# Patient Record
Sex: Female | Born: 1955
Health system: Southern US, Community
[De-identification: ages and names within clinical notes are randomized; demographics above are authoritative.]

## PROBLEM LIST (undated history)

## (undated) DIAGNOSIS — M5430 Sciatica, unspecified side: Secondary | ICD-10-CM

## (undated) DIAGNOSIS — E119 Type 2 diabetes mellitus without complications: Secondary | ICD-10-CM

## (undated) DIAGNOSIS — M1990 Unspecified osteoarthritis, unspecified site: Secondary | ICD-10-CM

## (undated) DIAGNOSIS — J189 Pneumonia, unspecified organism: Secondary | ICD-10-CM

## (undated) DIAGNOSIS — I10 Essential (primary) hypertension: Secondary | ICD-10-CM

## (undated) HISTORY — DX: Sciatica, unspecified side: M54.30

## (undated) HISTORY — PX: TONSILLECTOMY: SUR1361

## (undated) HISTORY — DX: Type 2 diabetes mellitus without complications: E11.9

## (undated) HISTORY — DX: Unspecified osteoarthritis, unspecified site: M19.90

## (undated) HISTORY — DX: Essential (primary) hypertension: I10

---

## 2004-10-03 ENCOUNTER — Ambulatory Visit: Payer: Self-pay | Admitting: Family Medicine

## 2008-12-30 ENCOUNTER — Emergency Department (HOSPITAL_COMMUNITY): Admission: EM | Admit: 2008-12-30 | Discharge: 2008-12-30 | Payer: Self-pay | Admitting: Emergency Medicine

## 2010-08-07 ENCOUNTER — Ambulatory Visit: Payer: Self-pay | Admitting: Family Medicine

## 2011-08-13 ENCOUNTER — Ambulatory Visit: Payer: Self-pay | Admitting: Family Medicine

## 2012-11-05 ENCOUNTER — Encounter: Payer: Self-pay | Admitting: Obstetrics & Gynecology

## 2012-11-05 ENCOUNTER — Ambulatory Visit (INDEPENDENT_AMBULATORY_CARE_PROVIDER_SITE_OTHER): Payer: PRIVATE HEALTH INSURANCE | Admitting: Obstetrics & Gynecology

## 2012-11-05 VITALS — BP 130/70 | Ht 61.0 in | Wt 190.0 lb

## 2012-11-05 DIAGNOSIS — Z1212 Encounter for screening for malignant neoplasm of rectum: Secondary | ICD-10-CM

## 2012-11-05 DIAGNOSIS — Z01419 Encounter for gynecological examination (general) (routine) without abnormal findings: Secondary | ICD-10-CM

## 2012-11-05 NOTE — Progress Notes (Signed)
Patient ID: Annette Washington, female   DOB: February 20, 1956, 57 y.o.   MRN: [id number] Subjective:     Annette Washington is a 57 y.o. female here for a routine exam.  No LMP recorded. No obstetric history on file. Current complaints: none.  Personal health questionnaire reviewed: yes.   Gynecologic History No LMP recorded. Contraception: post menopausal status Last Pap: 2013. Results were: normal Last mammogram: 2013. Results were: normal  Obstetric History OB History   Grav Para Term Preterm Abortions TAB SAB Ect Mult Living                   The following portions of the patient's history were reviewed and updated as appropriate: allergies, current medications, past family history, past medical history, past social history, past surgical history and problem list.  Review of Systems  Review of Systems  Constitutional: Negative for fever, chills, weight loss, malaise/fatigue and diaphoresis.  HENT: Negative for hearing loss, ear pain, nosebleeds, congestion, sore throat, neck pain, tinnitus and ear discharge.   Eyes: Negative for blurred vision, double vision, photophobia, pain, discharge and redness.  Respiratory: Negative for cough, hemoptysis, sputum production, shortness of breath, wheezing and stridor.   Cardiovascular: Negative for chest pain, palpitations, orthopnea, claudication, leg swelling and PND.  Gastrointestinal: negative for abdominal pain. Negative for heartburn, nausea, vomiting, diarrhea, constipation, blood in stool and melena.  Genitourinary: Negative for dysuria, urgency, frequency, hematuria and flank pain.  Musculoskeletal: Negative for myalgias, back pain, joint pain and falls.  Skin: Negative for itching and rash.  Neurological: Negative for dizziness, tingling, tremors, sensory change, speech change, focal weakness, seizures, loss of consciousness, weakness and headaches.  Endo/Heme/Allergies: Negative for environmental allergies and polydipsia. Does not  bruise/bleed easily.  Psychiatric/Behavioral: Negative for depression, suicidal ideas, hallucinations, memory loss and substance abuse. The patient is not nervous/anxious and does not have insomnia.        Objective:    Physical Exam  Vitals reviewed. Constitutional: She is oriented to person, place, and time. She appears well-developed and well-nourished.  HENT:  Head: Normocephalic and atraumatic.        Right Ear: External ear normal.  Left Ear: External ear normal.  Nose: Nose normal.  Mouth/Throat: Oropharynx is clear and moist.  Eyes: Conjunctivae and EOM are normal. Pupils are equal, round, and reactive to light. Right eye exhibits no discharge. Left eye exhibits no discharge. No scleral icterus.  Neck: Normal range of motion. Neck supple. No tracheal deviation present. No thyromegaly present.  Cardiovascular: Normal rate, regular rhythm, normal heart sounds and intact distal pulses.  Exam reveals no gallop and no friction rub.   No murmur heard. Respiratory: Effort normal and breath sounds normal. No respiratory distress. She has no wheezes. She has no rales. She exhibits no tenderness.  GI: Soft. Bowel sounds are normal. She exhibits no distension and no mass. There is no tenderness. There is no rebound and no guarding.  Genitourinary:       Vulva is normal without lesions Vagina is pink moist without discharge Cervix normal in appearance and pap is done Uterus is normal size shape and contour Adnexa is negative with normal sized ovaries  Hemoccult was negative  Musculoskeletal: Normal range of motion. She exhibits no edema and no tenderness.  Neurological: She is alert and oriented to person, place, and time. She has normal reflexes. She displays normal reflexes. No cranial nerve deficit. She exhibits normal muscle tone. Coordination normal.  Skin: Skin is warm  and dry. No rash noted. No erythema. No pallor.  Psychiatric: She has a normal mood and affect. Her behavior is  normal. Judgment and thought content normal.       Assessment:    Healthy female exam.    Plan:    Mammogram ordered. Follow up in: 1 year.

## 2012-11-05 NOTE — Patient Instructions (Addendum)
Mammography Mammography is an X-ray of the breasts to look for changes that are not normal. The X-ray image is called a mammogram. This procedure can screen for breast cancer, can detect cancer early, and can diagnose cancer.  LET YOUR CAREGIVER KNOW ABOUT:  Breast implants.  Previous breast disease, biopsy, or surgery.  If you are breastfeeding.  Medicines taken, including vitamins, herbs, eyedrops, over-the-counter medicines, and creams.  Use of steroids (by mouth or creams).  Possibility of pregnancy, if this applies. RISKS AND COMPLICATIONS  Exposure to radiation, but at very low levels.  The results may be misinterpreted.  The results may not be accurate.  Mammography may lead to further tests.  Mammography may not catch certain cancers. BEFORE THE PROCEDURE  Schedule your test about 7 days after your menstrual period. This is when your breasts are the least tender and have signs of hormone changes.  If you have had a mammography done at a different facility in the past, get the mammogram X-rays or have them sent to your current exam facility in order to compare them.  Wash your breasts and under your arms the day of the test.  Do not wear deodorants, perfumes, or powders anywhere on your body.  Wear clothes that you can change in and out of easily. PROCEDURE Relax as much as possible during the test. Any discomfort during the test will be very brief. The test should take less than 30 minutes. The following will happen:  You will undress from the waist up and put on a gown.  You will stand in front of the X-ray machine.  Each breast will be placed between 2 plastic or glass plates. The plates will compress your breast for a few seconds.  X-rays will be taken from different angles of the breast. AFTER THE PROCEDURE  The mammogram will be examined.  Depending on the quality of the images, you may need to repeat certain parts of the test.  Ask when your test  results will be ready. Make sure you get your test results.  You may resume normal activities. Document Released: 06/13/2000 Document Revised: 09/08/2011 Document Reviewed: 04/06/2011 [employer] Patient Information 2013 [employer].  

## 2013-07-31 ENCOUNTER — Emergency Department (HOSPITAL_COMMUNITY)
Admission: EM | Admit: 2013-07-31 | Discharge: 2013-07-31 | Disposition: A | Discharge status: Home / Self Care | Payer: PRIVATE HEALTH INSURANCE | Attending: Emergency Medicine | Admitting: Emergency Medicine

## 2013-07-31 ENCOUNTER — Encounter (HOSPITAL_COMMUNITY): Payer: Self-pay | Admitting: Emergency Medicine

## 2013-07-31 DIAGNOSIS — I10 Essential (primary) hypertension: Secondary | ICD-10-CM | POA: Insufficient documentation

## 2013-07-31 DIAGNOSIS — R21 Rash and other nonspecific skin eruption: Secondary | ICD-10-CM | POA: Insufficient documentation

## 2013-07-31 DIAGNOSIS — Z79899 Other long term (current) drug therapy: Secondary | ICD-10-CM | POA: Insufficient documentation

## 2013-07-31 DIAGNOSIS — R509 Fever, unspecified: Secondary | ICD-10-CM | POA: Insufficient documentation

## 2013-07-31 MED ORDER — DIPHENHYDRAMINE HCL 25 MG PO TABS: 25.0000 mg | ORAL_TABLET | Freq: Three times a day (TID) | ORAL | Status: DC

## 2013-07-31 MED ORDER — FAMOTIDINE 20 MG PO TABS: 20.0000 mg | ORAL_TABLET | Freq: Two times a day (BID) | ORAL | Status: DC

## 2013-07-31 MED ORDER — PREDNISONE 20 MG PO TABS: 40.0000 mg | ORAL_TABLET | Freq: Every day | ORAL | Status: AC

## 2013-07-31 MED ORDER — PREDNISONE 50 MG PO TABS
60.0000 mg | ORAL_TABLET | ORAL | Status: AC
  Administered 2013-07-31: 60 mg via ORAL
  Filled 2013-07-31 (×2): qty 1

## 2013-07-31 MED ORDER — FAMOTIDINE 20 MG PO TABS
20.0000 mg | ORAL_TABLET | Freq: Once | ORAL | Status: AC
  Administered 2013-07-31: 20 mg via ORAL
  Filled 2013-07-31: qty 1

## 2013-07-31 NOTE — ED Provider Notes (Signed)
CSN: [id number]     Arrival date & time 07/31/13  0932 History   First MD Initiated Contact with Patient 07/31/13 0703     Chief Complaint  Patient presents with  . Rash  . Fever    HPI  The patient presents with rash. Rash began 2 days ago.  Since onset, she developed rash on her torso, legs, arms.  No facial or mucosal involvement. No dyspnea, fever, nausea, vomiting. Rash is not painful, and minimally pruritic. Some relief with Benadryl. The only new activity the patient recalls is taking a new brand of OTC pain medication the day prior to the onset of rash.   Past Medical History  Diagnosis Date  . Hypertension    Past Surgical History  Procedure Laterality Date  . Tonsillectomy     Family History  Problem Relation Age of Onset  . Kidney disease Mother   . Breast cancer Maternal Grandmother    History  Substance Use Topics  . Smoking status: Never Smoker   . Smokeless tobacco: Not on file  . Alcohol Use: No   OB History   Grav Para Term Preterm Abortions TAB SAB Ect Mult Living                 Review of Systems  Constitutional:       Per HPI, otherwise negative  HENT:       Per HPI, otherwise negative  Respiratory:       Per HPI, otherwise negative  Cardiovascular:       Per HPI, otherwise negative  Gastrointestinal: Negative for nausea and vomiting.  Endocrine:       Negative aside from HPI  Genitourinary:       Neg aside from HPI   Musculoskeletal:       Per HPI, otherwise negative  Skin: Positive for rash.  Neurological: Negative for weakness and headaches.    Allergies  Review of patient's allergies indicates no known allergies.  Home Medications   Current Outpatient Rx  Name  Route  Sig  Dispense  Refill  . diphenhydrAMINE (BENADRYL) 25 MG tablet   Oral   Take 1 tablet (25 mg total) by mouth 3 (three) times daily.   8 tablet   0   . famotidine (PEPCID) 20 MG tablet   Oral   Take 1 tablet (20 mg total) by mouth 2 (two) times daily.   5 tablet   0   . losartan-hydrochlorothiazide (HYZAAR) 50-12.5 MG per tablet   Oral   Take 1 tablet by mouth daily.         . predniSONE (DELTASONE) 20 MG tablet   Oral   Take 2 tablets (40 mg total) by mouth daily with breakfast.   4 tablet   0    BP 146/81  Pulse 97  Temp(Src) 99.1 F (37.3 C) (Oral)  Resp 20  Ht 5\' 1"  (1.549 m)  Wt 178 lb (80.74 kg)  BMI 33.65 kg/m2  SpO2 99% Physical Exam  Nursing note and vitals reviewed. Constitutional: She is oriented to person, place, and time. She appears well-developed and well-nourished. No distress.  HENT:  Head: Normocephalic and atraumatic.  Mouth/Throat: Uvula is midline and mucous membranes are normal. No uvula swelling. No oropharyngeal exudate, posterior oropharyngeal edema, posterior oropharyngeal erythema or tonsillar abscesses.  Eyes: Conjunctivae and EOM are normal.  Cardiovascular: Normal rate and regular rhythm.   Pulmonary/Chest: Effort normal and breath sounds normal. No stridor. No respiratory distress.  Abdominal:  She exhibits no distension.  Musculoskeletal: She exhibits no edema.  Neurological: She is alert and oriented to person, place, and time. No cranial nerve deficit.  Skin: Skin is warm and dry. Rash noted.  Scattered throughout the torso, both legs, arms, but with no glabrous skin involvement there is a minimally raised, confluent patchy rash.  Psychiatric: She has a normal mood and affect.    ED Course  Procedures (including critical care time) Labs Review Labs Reviewed - No data to display Imaging Review No results found.  EKG Interpretation   None       MDM   1. Rash      This generally well female presents with rash.  Notably the patient has no fever, no dyspnea, no facial or mucosal involvement.  There is low suspicion for systemic pathology given this reassuring findings.  No clear precipitant, the patient is a new medication suggests allergic reaction.  Absent distress, with  stable vital signs, the patient was discharged in stable condition to follow up with primary care or dermatology as needed.  Carmin Muskrat, MD 07/31/13 [id number]

## 2013-07-31 NOTE — ED Notes (Addendum)
Pt c/o fever that started Friday, rash that started Saturday, denies any new foods or medications, states that the rash has started itching this am, reports that she had not traveled anywhere and did receive her immunization for measles. Pt has raised red areas ?hives over entire body area, denies any sob, problems swallowing,

## 2013-07-31 NOTE — Discharge Instructions (Signed)
As discussed, there are currently no concerning aspects of your rash, and it should disappear over the next days with appropriate medication use.  However, please be sure to return here if you develop new, or concerning changes in your condition.

## 2013-11-17 ENCOUNTER — Other Ambulatory Visit: Payer: Self-pay | Admitting: Obstetrics & Gynecology

## 2013-11-17 DIAGNOSIS — Z1231 Encounter for screening mammogram for malignant neoplasm of breast: Secondary | ICD-10-CM

## 2013-12-02 ENCOUNTER — Other Ambulatory Visit: Payer: PRIVATE HEALTH INSURANCE | Admitting: Obstetrics & Gynecology

## 2013-12-02 ENCOUNTER — Ambulatory Visit (HOSPITAL_COMMUNITY): Payer: PRIVATE HEALTH INSURANCE

## 2013-12-09 ENCOUNTER — Ambulatory Visit (HOSPITAL_COMMUNITY)
Admission: RE | Admit: 2013-12-09 | Discharge: 2013-12-09 | Disposition: A | Discharge status: Home / Self Care | Payer: PRIVATE HEALTH INSURANCE | Source: Ambulatory Visit | Attending: Obstetrics & Gynecology | Admitting: Obstetrics & Gynecology

## 2013-12-09 DIAGNOSIS — Z1231 Encounter for screening mammogram for malignant neoplasm of breast: Secondary | ICD-10-CM | POA: Insufficient documentation

## 2013-12-23 ENCOUNTER — Encounter: Payer: Self-pay | Admitting: Obstetrics & Gynecology

## 2013-12-23 ENCOUNTER — Ambulatory Visit (INDEPENDENT_AMBULATORY_CARE_PROVIDER_SITE_OTHER): Payer: PRIVATE HEALTH INSURANCE | Admitting: Obstetrics & Gynecology

## 2013-12-23 ENCOUNTER — Other Ambulatory Visit (HOSPITAL_COMMUNITY)
Admission: RE | Admit: 2013-12-23 | Discharge: 2013-12-23 | Disposition: A | Discharge status: Home / Self Care | Payer: PRIVATE HEALTH INSURANCE | Source: Ambulatory Visit | Attending: Obstetrics & Gynecology | Admitting: Obstetrics & Gynecology

## 2013-12-23 VITALS — BP 110/80 | Ht 60.0 in | Wt 192.0 lb

## 2013-12-23 DIAGNOSIS — Z01419 Encounter for gynecological examination (general) (routine) without abnormal findings: Secondary | ICD-10-CM | POA: Diagnosis not present

## 2013-12-23 DIAGNOSIS — Z1212 Encounter for screening for malignant neoplasm of rectum: Secondary | ICD-10-CM

## 2013-12-23 DIAGNOSIS — I10 Essential (primary) hypertension: Secondary | ICD-10-CM | POA: Insufficient documentation

## 2013-12-23 DIAGNOSIS — Z1151 Encounter for screening for human papillomavirus (HPV): Secondary | ICD-10-CM | POA: Insufficient documentation

## 2013-12-23 NOTE — Progress Notes (Signed)
Patient ID: [name], female   DOB: [birth year], 58 y.o.   MRN: [id number] Blood pressure 110/80, height 5' (1.524 m), weight 192 lb (87.091 kg).  Subjective:     Annette Washington is a 58 y.o. female here for a routine exam.  No LMP recorded. Patient is postmenopausal. No obstetric history on file. Birth Control Method:  na Menstrual Calendar(currently): post menopausal  Current complaints: post menopausal.   Current acute medical issues:  Sinusitis s/p antibiotics   Recent Gynecologic History No LMP recorded. Patient is postmenopausal. Last Pap: 2014,  normal Last mammogram: 11/2013,  normal  Past Medical History  Diagnosis Date  . Hypertension     Past Surgical History  Procedure Laterality Date  . Tonsillectomy      OB History   Grav Para Term Preterm Abortions TAB SAB Ect Mult Living                  History   Social History  . Marital Status: Married    Spouse Name: N/A    Number of Children: N/A  . Years of Education: N/A   Social History Main Topics  . Smoking status: Never Smoker   . Smokeless tobacco: None  . Alcohol Use: No  . Drug Use: No  . Sexual Activity: Yes   Other Topics Concern  . None   Social History Narrative  . None    Family History  Problem Relation Age of Onset  . Kidney disease Mother   . Breast cancer Maternal Grandmother      Review of Systems  Review of Systems  Constitutional: Negative for fever, chills, weight loss, malaise/fatigue and diaphoresis.  HENT: Negative for hearing loss, ear pain, nosebleeds, congestion, sore throat, neck pain, tinnitus and ear discharge.   Eyes: Negative for blurred vision, double vision, photophobia, pain, discharge and redness.  Respiratory: Negative for cough, hemoptysis, sputum production, shortness of breath, wheezing and stridor.   Cardiovascular: Negative for chest pain, palpitations, orthopnea, claudication, leg swelling and PND.  Gastrointestinal: negative for abdominal  pain. Negative for heartburn, nausea, vomiting, diarrhea, constipation, blood in stool and melena.  Genitourinary: Negative for dysuria, urgency, frequency, hematuria and flank pain.  Musculoskeletal: Negative for myalgias, back pain, joint pain and falls.  Skin: Negative for itching and rash.  Neurological: Negative for dizziness, tingling, tremors, sensory change, speech change, focal weakness, seizures, loss of consciousness, weakness and headaches.  Endo/Heme/Allergies: Negative for environmental allergies and polydipsia. Does not bruise/bleed easily.  Psychiatric/Behavioral: Negative for depression, suicidal ideas, hallucinations, memory loss and substance abuse. The patient is not nervous/anxious and does not have insomnia.        Objective:    Physical Exam  Vitals reviewed. Constitutional: She is oriented to person, place, and time. She appears well-developed and well-nourished.  HENT:  Head: Normocephalic and atraumatic.        Right Ear: External ear normal.  Left Ear: External ear normal.  Nose: Nose normal.  Mouth/Throat: Oropharynx is clear and moist.  Eyes: Conjunctivae and EOM are normal. Pupils are equal, round, and reactive to light. Right eye exhibits no discharge. Left eye exhibits no discharge. No scleral icterus.  Neck: Normal range of motion. Neck supple. No tracheal deviation present. No thyromegaly present.  Cardiovascular: Normal rate, regular rhythm, normal heart sounds and intact distal pulses.  Exam reveals no gallop and no friction rub.   No murmur heard. Respiratory: Effort normal and breath sounds normal. No respiratory distress. She has no  wheezes. She has no rales. She exhibits no tenderness.  GI: Soft. Bowel sounds are normal. She exhibits no distension and no mass. There is no tenderness. There is no rebound and no guarding.  Genitourinary:  Breasts no masses skin changes or nipple changes bilaterally      Vulva is normal without lesions Vagina is pink  moist without discharge Cervix normal in appearance and pap is done Uterus is normal size shape and contour Adnexa is negative with normal sized ovaries  Rectal    hemoccult negative, normal tone, no masses  Musculoskeletal: Normal range of motion. She exhibits no edema and no tenderness.  Neurological: She is alert and oriented to person, place, and time. She has normal reflexes. She displays normal reflexes. No cranial nerve deficit. She exhibits normal muscle tone. Coordination normal.  Skin: Skin is warm and dry. No rash noted. No erythema. No pallor.  Psychiatric: She has a normal mood and affect. Her behavior is normal. Judgment and thought content normal.       Assessment:    Healthy female exam.    Plan:    Follow up in: 1 year.

## 2013-12-28 LAB — CYTOLOGY - PAP

## 2015-09-14 ENCOUNTER — Other Ambulatory Visit: Payer: PRIVATE HEALTH INSURANCE | Admitting: Obstetrics & Gynecology

## 2015-09-20 ENCOUNTER — Other Ambulatory Visit: Payer: Self-pay | Admitting: Obstetrics & Gynecology

## 2015-09-20 DIAGNOSIS — Z1231 Encounter for screening mammogram for malignant neoplasm of breast: Secondary | ICD-10-CM

## 2015-09-21 ENCOUNTER — Ambulatory Visit (INDEPENDENT_AMBULATORY_CARE_PROVIDER_SITE_OTHER): Payer: PRIVATE HEALTH INSURANCE | Admitting: Obstetrics & Gynecology

## 2015-09-21 ENCOUNTER — Other Ambulatory Visit (HOSPITAL_COMMUNITY)
Admission: RE | Admit: 2015-09-21 | Discharge: 2015-09-21 | Disposition: A | Discharge status: Home / Self Care | Payer: PRIVATE HEALTH INSURANCE | Source: Ambulatory Visit | Attending: Obstetrics & Gynecology | Admitting: Obstetrics & Gynecology

## 2015-09-21 ENCOUNTER — Encounter: Payer: Self-pay | Admitting: Obstetrics & Gynecology

## 2015-09-21 VITALS — BP 140/80 | HR 72 | Ht 61.0 in | Wt 193.0 lb

## 2015-09-21 DIAGNOSIS — Z01419 Encounter for gynecological examination (general) (routine) without abnormal findings: Secondary | ICD-10-CM | POA: Insufficient documentation

## 2015-09-21 DIAGNOSIS — Z1211 Encounter for screening for malignant neoplasm of colon: Secondary | ICD-10-CM

## 2015-09-21 DIAGNOSIS — Z1212 Encounter for screening for malignant neoplasm of rectum: Secondary | ICD-10-CM

## 2015-09-21 NOTE — Progress Notes (Signed)
Patient ID: Annette Washington, female   DOB: 1955/07/04, 60 y.o.   MRN: [id number] Subjective:     Annette Washington is a 60 y.o. female here for a routine exam.  No LMP recorded. Patient is postmenopausal. No obstetric history on file. Birth Control Method:  amenorrheic Menstrual Calendar(currently): amenorrheic  Current complaints: none.   Current acute medical issues:  none   Recent Gynecologic History No LMP recorded. Patient is postmenopausal. Last Pap: 2016,  normal Last mammogram: next week,    Past Medical History  Diagnosis Date  . Hypertension   . Diabetes mellitus without complication [facility])     Past Surgical History  Procedure Laterality Date  . Tonsillectomy      OB History    No [place] available      Social History   Social History  . Marital Status: Married    Spouse Name: N/A  . Number of Children: N/A  . Years of Education: N/A   Social History Main Topics  . Smoking status: Never Smoker   . Smokeless tobacco: None  . Alcohol Use: No  . Drug Use: No  . Sexual Activity: Yes   Other Topics Concern  . None   Social History Narrative    Family History  Problem Relation Age of Onset  . Kidney disease Mother   . Breast cancer Maternal Grandmother      Current outpatient prescriptions:  .  amLODipine (NORVASC) 5 MG tablet, Take 5 mg by mouth daily., Disp: , Rfl:  .  losartan-hydrochlorothiazide (HYZAAR) 50-12.5 MG per tablet, Take 1 tablet by mouth daily., Disp: , Rfl:  .  metFORMIN (GLUCOPHAGE) 500 MG tablet, Take by mouth 2 (two) times daily with a meal., Disp: , Rfl:  .  pravastatin (PRAVACHOL) 10 MG tablet, Take 10 mg by mouth daily., Disp: , Rfl:  .  diphenhydrAMINE (BENADRYL) 25 MG tablet, Take 1 tablet (25 mg total) by mouth 3 (three) times daily., Disp: 8 tablet, Rfl: 0 .  famotidine (PEPCID) 20 MG tablet, Take 1 tablet (20 mg total) by mouth 2 (two) times daily., Disp: 5 tablet, Rfl: 0  Review of Systems  Review of Systems   Constitutional: Negative for fever, chills, weight loss, malaise/fatigue and diaphoresis.  HENT: Negative for hearing loss, ear pain, nosebleeds, congestion, sore throat, neck pain, tinnitus and ear discharge.   Eyes: Negative for blurred vision, double vision, photophobia, pain, discharge and redness.  Respiratory: Negative for cough, hemoptysis, sputum production, shortness of breath, wheezing and stridor.   Cardiovascular: Negative for chest pain, palpitations, orthopnea, claudication, leg swelling and PND.  Gastrointestinal: negative for abdominal pain. Negative for heartburn, nausea, vomiting, diarrhea, constipation, blood in stool and melena.  Genitourinary: Negative for dysuria, urgency, frequency, hematuria and flank pain.  Musculoskeletal: Negative for myalgias, back pain, joint pain and falls.  Skin: Negative for itching and rash.  Neurological: Negative for dizziness, tingling, tremors, sensory change, speech change, focal weakness, seizures, loss of consciousness, weakness and headaches.  Endo/Heme/Allergies: Negative for environmental allergies and polydipsia. Does not bruise/bleed easily.  Psychiatric/Behavioral: Negative for depression, suicidal ideas, hallucinations, memory loss and substance abuse. The patient is not nervous/anxious and does not have insomnia.        Objective:  Blood pressure 140/80, pulse 72, height 5\' 1"  (1.549 m), weight 193 lb (87.544 kg).   Physical Exam  Vitals reviewed. Constitutional: She is oriented to person, place, and time. She appears well-developed and well-nourished.  HENT:  Head: Normocephalic and atraumatic.  Right Ear: External ear normal.  Left Ear: External ear normal.  Nose: Nose normal.  Mouth/Throat: Oropharynx is clear and moist.  Eyes: Conjunctivae and EOM are normal. Pupils are equal, round, and reactive to light. Right eye exhibits no discharge. Left eye exhibits no discharge. No scleral icterus.  Neck: Normal range of  motion. Neck supple. No tracheal deviation present. No thyromegaly present.  Cardiovascular: Normal rate, regular rhythm, normal heart sounds and intact distal pulses.  Exam reveals no gallop and no friction rub.   No murmur heard. Respiratory: Effort normal and breath sounds normal. No respiratory distress. She has no wheezes. She has no rales. She exhibits no tenderness.  GI: Soft. Bowel sounds are normal. She exhibits no distension and no mass. There is no tenderness. There is no rebound and no guarding.  Genitourinary:  Breasts no masses skin changes or nipple changes bilaterally      Vulva is normal without lesions Vagina is pink moist without discharge Cervix normal in appearance and pap is done Uterus is normal size shape and contour Adnexa is negative with normal sized ovaries  {Rectal    hemoccult negative, normal tone, no masses  Musculoskeletal: Normal range of motion. She exhibits no edema and no tenderness.  Neurological: She is alert and oriented to person, place, and time. She has normal reflexes. She displays normal reflexes. No cranial nerve deficit. She exhibits normal muscle tone. Coordination normal.  Skin: Skin is warm and dry. No rash noted. No erythema. No pallor.  Psychiatric: She has a normal mood and affect. Her behavior is normal. Judgment and thought content normal.       Assessment:    Healthy female exam.    Plan:    Mammogram ordered. Follow up in: 1 year.    Meds ordered this encounter  Medications  . pravastatin (PRAVACHOL) 10 MG tablet    Sig: Take 10 mg by mouth daily.  Marland Kitchen amLODipine (NORVASC) 5 MG tablet    Sig: Take 5 mg by mouth daily.  . metFORMIN (GLUCOPHAGE) 500 MG tablet    Sig: Take by mouth 2 (two) times daily with a meal.    No orders of the defined types were placed in this encounter.

## 2015-09-25 LAB — CYTOLOGY - PAP

## 2015-09-28 ENCOUNTER — Ambulatory Visit (HOSPITAL_COMMUNITY): Payer: PRIVATE HEALTH INSURANCE

## 2016-01-04 ENCOUNTER — Ambulatory Visit (HOSPITAL_COMMUNITY)
Admission: RE | Admit: 2016-01-04 | Discharge: 2016-01-04 | Disposition: A | Discharge status: Home / Self Care | Payer: PRIVATE HEALTH INSURANCE | Source: Ambulatory Visit | Attending: Obstetrics & Gynecology | Admitting: Obstetrics & Gynecology

## 2016-01-04 DIAGNOSIS — Z1231 Encounter for screening mammogram for malignant neoplasm of breast: Secondary | ICD-10-CM | POA: Insufficient documentation

## 2016-08-22 ENCOUNTER — Encounter (INDEPENDENT_AMBULATORY_CARE_PROVIDER_SITE_OTHER): Payer: Self-pay | Admitting: *Deleted

## 2016-10-03 ENCOUNTER — Other Ambulatory Visit: Payer: PRIVATE HEALTH INSURANCE | Admitting: Obstetrics & Gynecology

## 2016-10-09 ENCOUNTER — Encounter (INDEPENDENT_AMBULATORY_CARE_PROVIDER_SITE_OTHER): Payer: Self-pay | Admitting: *Deleted

## 2016-10-10 ENCOUNTER — Other Ambulatory Visit (INDEPENDENT_AMBULATORY_CARE_PROVIDER_SITE_OTHER): Payer: Self-pay | Admitting: *Deleted

## 2016-10-10 DIAGNOSIS — Z1211 Encounter for screening for malignant neoplasm of colon: Secondary | ICD-10-CM

## 2016-12-02 ENCOUNTER — Other Ambulatory Visit: Payer: Self-pay | Admitting: Obstetrics & Gynecology

## 2016-12-02 DIAGNOSIS — Z1231 Encounter for screening mammogram for malignant neoplasm of breast: Secondary | ICD-10-CM

## 2016-12-30 ENCOUNTER — Telehealth (INDEPENDENT_AMBULATORY_CARE_PROVIDER_SITE_OTHER): Payer: Self-pay | Admitting: *Deleted

## 2016-12-30 ENCOUNTER — Encounter (INDEPENDENT_AMBULATORY_CARE_PROVIDER_SITE_OTHER): Payer: Self-pay | Admitting: *Deleted

## 2016-12-30 DIAGNOSIS — Z1211 Encounter for screening for malignant neoplasm of colon: Secondary | ICD-10-CM

## 2016-12-30 NOTE — Telephone Encounter (Signed)
Patient needs trilyte -- screening

## 2017-01-01 MED ORDER — PEG 3350-KCL-NA BICARB-NACL 420 G PO SOLR: 4000.0000 mL | Freq: Once | ORAL | 0 refills | Status: AC

## 2017-01-09 ENCOUNTER — Encounter: Payer: Self-pay | Admitting: Obstetrics & Gynecology

## 2017-01-09 ENCOUNTER — Other Ambulatory Visit (HOSPITAL_COMMUNITY)
Admission: RE | Admit: 2017-01-09 | Discharge: 2017-01-09 | Disposition: A | Discharge status: Home / Self Care | Payer: PRIVATE HEALTH INSURANCE | Source: Ambulatory Visit | Attending: Obstetrics & Gynecology | Admitting: Obstetrics & Gynecology

## 2017-01-09 ENCOUNTER — Ambulatory Visit (INDEPENDENT_AMBULATORY_CARE_PROVIDER_SITE_OTHER): Payer: PRIVATE HEALTH INSURANCE | Admitting: Obstetrics & Gynecology

## 2017-01-09 ENCOUNTER — Ambulatory Visit (HOSPITAL_COMMUNITY)
Admission: RE | Admit: 2017-01-09 | Discharge: 2017-01-09 | Disposition: A | Discharge status: Home / Self Care | Payer: PRIVATE HEALTH INSURANCE | Source: Ambulatory Visit | Attending: Obstetrics & Gynecology | Admitting: Obstetrics & Gynecology

## 2017-01-09 VITALS — BP 148/90 | HR 60 | Ht 60.5 in | Wt 192.0 lb

## 2017-01-09 DIAGNOSIS — Z01419 Encounter for gynecological examination (general) (routine) without abnormal findings: Secondary | ICD-10-CM | POA: Diagnosis not present

## 2017-01-09 DIAGNOSIS — Z1211 Encounter for screening for malignant neoplasm of colon: Secondary | ICD-10-CM

## 2017-01-09 DIAGNOSIS — Z1212 Encounter for screening for malignant neoplasm of rectum: Secondary | ICD-10-CM | POA: Diagnosis not present

## 2017-01-09 DIAGNOSIS — Z1231 Encounter for screening mammogram for malignant neoplasm of breast: Secondary | ICD-10-CM | POA: Diagnosis not present

## 2017-01-09 LAB — HEMOCCULT GUIAC POC 1CARD (OFFICE): FECAL OCCULT BLD: NEGATIVE

## 2017-01-09 NOTE — Addendum Note (Signed)
Addended by: Gaylyn Rong A on: 01/09/2017 10:22 AM   Modules accepted: Orders

## 2017-01-09 NOTE — Progress Notes (Signed)
Subjective:     Annette Washington is a 61 y.o. female here for a routine exam.  No LMP recorded. Patient is postmenopausal. [id number] Birth Control Method:  postmenopausal Menstrual Calendar(currently): amenorrheic  Current complaints: none.   Current acute medical issues:  none   Recent Gynecologic History No LMP recorded. Patient is postmenopausal. Last Pap: 08/2015,  normal Last mammogram: today,  pending  Past Medical History:  Diagnosis Date  . Diabetes mellitus without complication ([place])   . Hypertension     Past Surgical History:  Procedure Laterality Date  . TONSILLECTOMY      OB History    Gravida Para Term Preterm AB Living   3 3 3     2    SAB TAB Ectopic Multiple Live Births           2      Social History   Social History  . Marital status: Married    Spouse name: N/A  . Number of children: N/A  . Years of education: N/A   Social History Main Topics  . Smoking status: Former Smoker    Types: Cigarettes  . Smokeless tobacco: Never Used  . Alcohol use No  . Drug use: No  . Sexual activity: Yes    Birth control/ protection: Post-menopausal   Other Topics Concern  . None   Social History Narrative  . None    Family History  Problem Relation Age of Onset  . Kidney disease Mother   . Breast cancer Maternal Grandmother      Current Outpatient Prescriptions:  .  amLODipine (NORVASC) 5 MG tablet, Take 5 mg by mouth daily., Disp: , Rfl:  .  ibuprofen (ADVIL,MOTRIN) 200 MG tablet, Take 600 mg by mouth as needed., Disp: , Rfl:  .  losartan-hydrochlorothiazide (HYZAAR) 50-12.5 MG per tablet, Take 1 tablet by mouth daily., Disp: , Rfl:  .  metFORMIN (GLUCOPHAGE) 500 MG tablet, Take by mouth daily with breakfast. , Disp: , Rfl:  .  pravastatin (PRAVACHOL) 10 MG tablet, Take 10 mg by mouth daily., Disp: , Rfl:  .  diphenhydrAMINE (BENADRYL) 25 MG tablet, Take 1 tablet (25 mg total) by mouth 3 (three) times daily., Disp: 8 tablet, Rfl: 0 .  famotidine  (PEPCID) 20 MG tablet, Take 1 tablet (20 mg total) by mouth 2 (two) times daily., Disp: 5 tablet, Rfl: 0  Review of Systems  Review of Systems  Constitutional: Negative for fever, chills, weight loss, malaise/fatigue and diaphoresis.  HENT: Negative for hearing loss, ear pain, nosebleeds, congestion, sore throat, neck pain, tinnitus and ear discharge.   Eyes: Negative for blurred vision, double vision, photophobia, pain, discharge and redness.  Respiratory: Negative for cough, hemoptysis, sputum production, shortness of breath, wheezing and stridor.   Cardiovascular: Negative for chest pain, palpitations, orthopnea, claudication, leg swelling and PND.  Gastrointestinal: negative for abdominal pain. Negative for heartburn, nausea, vomiting, diarrhea, constipation, blood in stool and melena.  Genitourinary: Negative for dysuria, urgency, frequency, hematuria and flank pain.  Musculoskeletal: Negative for myalgias, back pain, joint pain and falls.  Skin: Negative for itching and rash.  Neurological: Negative for dizziness, tingling, tremors, sensory change, speech change, focal weakness, seizures, loss of consciousness, weakness and headaches.  Endo/Heme/Allergies: Negative for environmental allergies and polydipsia. Does not bruise/bleed easily.  Psychiatric/Behavioral: Negative for depression, suicidal ideas, hallucinations, memory loss and substance abuse. The patient is not nervous/anxious and does not have insomnia.        Objective:  Blood pressure Marland Kitchen)  148/90, pulse 60, height 5' 0.5" (1.537 m), weight 192 lb (87.1 kg).   Physical Exam  Vitals reviewed. Constitutional: She is oriented to person, place, and time. She appears well-developed and well-nourished.  HENT:  Head: Normocephalic and atraumatic.        Right Ear: External ear normal.  Left Ear: External ear normal.  Nose: Nose normal.  Mouth/Throat: Oropharynx is clear and moist.  Eyes: Conjunctivae and EOM are normal. Pupils  are equal, round, and reactive to light. Right eye exhibits no discharge. Left eye exhibits no discharge. No scleral icterus.  Neck: Normal range of motion. Neck supple. No tracheal deviation present. No thyromegaly present.  Cardiovascular: Normal rate, regular rhythm, normal heart sounds and intact distal pulses.  Exam reveals no gallop and no friction rub.   No murmur heard. Respiratory: Effort normal and breath sounds normal. No respiratory distress. She has no wheezes. She has no rales. She exhibits no tenderness.  GI: Soft. Bowel sounds are normal. She exhibits no distension and no mass. There is no tenderness. There is no rebound and no guarding.  Genitourinary:  Breasts no masses skin changes or nipple changes bilaterally      Vulva is normal without lesions Vagina is pink moist without discharge Cervix normal in appearance and pap is done Uterus is normal size shape and contour Adnexa is negative with normal sized ovaries  {Rectal    hemoccult negative, normal tone, no masses  Musculoskeletal: Normal range of motion. She exhibits no edema and no tenderness.  Neurological: She is alert and oriented to person, place, and time. She has normal reflexes. She displays normal reflexes. No cranial nerve deficit. She exhibits normal muscle tone. Coordination normal.  Skin: Skin is warm and dry. No rash noted. No erythema. No pallor.  Psychiatric: She has a normal mood and affect. Her behavior is normal. Judgment and thought content normal.       Medications Ordered at today's visit: Meds ordered this encounter  Medications  . ibuprofen (ADVIL,MOTRIN) 200 MG tablet    Sig: Take 600 mg by mouth as needed.    Other orders placed at today's visit: No orders of the defined types were placed in this encounter.     Assessment:    Healthy female exam.   Hypertension Type 2 diabetes Plan:    Mammogram ordered. Follow up in: 2 years.     Return in about 2 years (around 01/10/2019)  for yearly, with Dr Elonda Husky.

## 2017-01-13 LAB — CYTOLOGY - PAP
DIAGNOSIS: NEGATIVE
HPV (WINDOPATH): NOT DETECTED

## 2017-01-16 ENCOUNTER — Telehealth (INDEPENDENT_AMBULATORY_CARE_PROVIDER_SITE_OTHER): Payer: Self-pay | Admitting: *Deleted

## 2017-01-16 NOTE — Telephone Encounter (Signed)
agree

## 2017-01-16 NOTE — Telephone Encounter (Signed)
Referring MD/PCP: kikel ([facility])   Procedure: tcs  Reason/Indication:  screening  Has patient had this procedure before?  no  If so, when, by whom and where?    Is there a family history of colon cancer?  no  Who?  What age when diagnosed?    Is patient diabetic?   borderline      Does patient have prosthetic heart valve or mechanical valve?  no  Do you have a pacemaker?  no  Has patient ever had endocarditis? no  Has patient had joint replacement within last 12 months?  no  Does patient tend to be constipated or take laxatives? no  Does patient have a history of alcohol/drug use?  no  Is patient on Coumadin, Plavix and/or Aspirin? no  Medications: amlodipine 5 mg daily, metformin 500 mg daily, losartan/hctyz 50/12.5 mg daily, zyrtec prn  Allergies: nkda  Medication Adjustment per Dr Laural Golden:   Procedure date & time: 02/12/17 at 730

## 2017-02-12 ENCOUNTER — Encounter (HOSPITAL_COMMUNITY): Payer: Self-pay | Admitting: *Deleted

## 2017-02-12 ENCOUNTER — Ambulatory Visit (HOSPITAL_COMMUNITY)
Admission: RE | Admit: 2017-02-12 | Discharge: 2017-02-12 | Disposition: A | Discharge status: Home / Self Care | Payer: PRIVATE HEALTH INSURANCE | Source: Ambulatory Visit | Attending: Internal Medicine | Admitting: Internal Medicine

## 2017-02-12 ENCOUNTER — Encounter (HOSPITAL_COMMUNITY): Payer: Self-pay | Admitting: Anesthesiology

## 2017-02-12 ENCOUNTER — Encounter (HOSPITAL_COMMUNITY)
Admission: RE | Disposition: A | Discharge status: Home / Self Care | Payer: Self-pay | Source: Ambulatory Visit | Attending: Internal Medicine

## 2017-02-12 DIAGNOSIS — Z79899 Other long term (current) drug therapy: Secondary | ICD-10-CM | POA: Diagnosis not present

## 2017-02-12 DIAGNOSIS — I10 Essential (primary) hypertension: Secondary | ICD-10-CM | POA: Diagnosis not present

## 2017-02-12 DIAGNOSIS — Z87891 Personal history of nicotine dependence: Secondary | ICD-10-CM | POA: Diagnosis not present

## 2017-02-12 DIAGNOSIS — E119 Type 2 diabetes mellitus without complications: Secondary | ICD-10-CM | POA: Insufficient documentation

## 2017-02-12 DIAGNOSIS — D123 Benign neoplasm of transverse colon: Secondary | ICD-10-CM | POA: Insufficient documentation

## 2017-02-12 DIAGNOSIS — Z1211 Encounter for screening for malignant neoplasm of colon: Secondary | ICD-10-CM | POA: Diagnosis not present

## 2017-02-12 DIAGNOSIS — Z886 Allergy status to analgesic agent status: Secondary | ICD-10-CM | POA: Insufficient documentation

## 2017-02-12 DIAGNOSIS — Z7984 Long term (current) use of oral hypoglycemic drugs: Secondary | ICD-10-CM | POA: Diagnosis not present

## 2017-02-12 HISTORY — DX: Pneumonia, unspecified organism: J18.9

## 2017-02-12 HISTORY — PX: COLONOSCOPY: SHX5424

## 2017-02-12 LAB — GLUCOSE, CAPILLARY: GLUCOSE-CAPILLARY: 86 mg/dL (ref 65–99)

## 2017-02-12 SURGERY — COLONOSCOPY
Anesthesia: Moderate Sedation

## 2017-02-12 MED ORDER — MIDAZOLAM HCL 5 MG/5ML IJ SOLN
INTRAMUSCULAR | Status: DC | PRN
  Administered 2017-02-12: 1 mg via INTRAVENOUS
  Administered 2017-02-12 (×2): 2 mg via INTRAVENOUS

## 2017-02-12 MED ORDER — MEPERIDINE HCL 50 MG/ML IJ SOLN
INTRAMUSCULAR | Status: DC | PRN
  Administered 2017-02-12 (×2): 25 mg via INTRAVENOUS

## 2017-02-12 MED ORDER — SPOT INK MARKER SYRINGE KIT
PACK | SUBMUCOSAL | Status: DC | PRN
  Administered 2017-02-12: 2 mL via SUBMUCOSAL

## 2017-02-12 MED ORDER — SODIUM CHLORIDE 0.9 % IV SOLN
INTRAVENOUS | Status: DC
  Administered 2017-02-12: 1000 mL via INTRAVENOUS

## 2017-02-12 MED ORDER — SIMETHICONE 40 MG/0.6ML PO SUSP
ORAL | Status: DC | PRN
  Administered 2017-02-12: 08:00:00

## 2017-02-12 MED ORDER — MEPERIDINE HCL 50 MG/ML IJ SOLN
INTRAMUSCULAR | Status: AC
  Filled 2017-02-12: qty 1

## 2017-02-12 MED ORDER — SPOT INK MARKER SYRINGE KIT
PACK | SUBMUCOSAL | Status: AC
  Filled 2017-02-12: qty 5

## 2017-02-12 MED ORDER — MIDAZOLAM HCL 5 MG/5ML IJ SOLN
INTRAMUSCULAR | Status: AC
  Filled 2017-02-12: qty 10

## 2017-02-12 NOTE — H&P (Signed)
Annette Washington is an 61 y.o. female.   Chief Complaint: Patient is here for colonoscopy. HPI: Patient is 61 year old African-American female was in for screening colonoscopy. She denies abdominal pain change in bowel habits or rectal bleeding. This is patient's first screening exam. Family history is negative for CRC.  Past Medical History:  Diagnosis Date  . Diabetes mellitus without complication ([place])   . Hypertension   . Pneumonia 20 years ago.     Past Surgical History:  Procedure Laterality Date  . TONSILLECTOMY      Family History  Problem Relation Age of Onset  . Kidney disease Mother   . Breast cancer Maternal Grandmother    Social History:  reports that she has quit smoking. Her smoking use included Cigarettes. She has never used smokeless tobacco. She reports that she does not drink alcohol or use drugs.  Allergies:  Allergies  Allergen Reactions  . Tylenol [Acetaminophen] Rash    Medications Prior to Admission  Medication Sig Dispense Refill  . amLODipine (NORVASC) 5 MG tablet Take 5 mg by mouth daily.    . cetirizine (ZYRTEC) 10 MG tablet Take 10 mg by mouth daily as needed for allergies.    Marland Kitchen ibuprofen (ADVIL,MOTRIN) 200 MG tablet Take 600 mg by mouth daily as needed for headache or moderate pain.     Marland Kitchen losartan-hydrochlorothiazide (HYZAAR) 50-12.5 MG per tablet Take 1 tablet by mouth daily.    . metFORMIN (GLUCOPHAGE) 500 MG tablet Take 500 mg by mouth every evening.     . pravastatin (PRAVACHOL) 10 MG tablet Take 10 mg by mouth daily.      Results for orders placed or performed during the hospital encounter of 02/12/17 (from the past 48 hour(s))  Glucose, capillary     Status: None   Collection Time: 02/12/17  6:54 AM  Result Value Ref Range   Glucose-Capillary 86 65 - 99 mg/dL   No results found.  ROS  Blood pressure (!) 150/72, pulse 70, temperature 98.7 F (37.1 C), temperature source Oral, resp. rate (!) 21, height 5\' 1"  (1.549 m), weight 191  lb (86.6 kg), SpO2 97 %. Physical Exam  Constitutional: She appears well-developed and well-nourished.  HENT:  Mouth/Throat: Oropharynx is clear and moist.  Eyes: Conjunctivae are normal. No scleral icterus.  Neck: No thyromegaly present.  Cardiovascular: Normal rate, regular rhythm and normal heart sounds.   No murmur heard. Respiratory: Effort normal and breath sounds normal.  GI: Soft. She exhibits no distension and no mass. There is no tenderness.  Musculoskeletal: She exhibits no edema.  Lymphadenopathy:    She has no cervical adenopathy.  Neurological: She is alert.  Skin: Skin is warm and dry.     Assessment/Plan Average risk screening colonoscopy.  Hildred Laser, MD 02/12/2017, 7:32 AM

## 2017-02-12 NOTE — Op Note (Signed)
[facility] Patient Name: Annette Washington Procedure Date: 02/12/2017 7:14 AM MRN: [id number] Date of Birth: 17-May-1956 Attending MD: Hildred Laser , MD CSN: [id number] Age: 61 Admit Type: Outpatient Procedure:                Colonoscopy Indications:              Screening for colorectal malignant neoplasm Providers:                Hildred Laser, MD, Otis Peak B. Sharon Seller, RN, Rosina Lowenstein, RN Referring MD:             Vidal Schwalbe, MD Medicines:                Meperidine 50 mg IV, Midazolam 5 mg IV Complications:            No immediate complications. Estimated Blood Loss:     Estimated blood loss: none. Procedure:                Pre-Anesthesia Assessment:                           - Prior to the procedure, a History and Physical                            was performed, and patient medications and                            allergies were reviewed. The patient's tolerance of                            previous anesthesia was also reviewed. The risks                            and benefits of the procedure and the sedation                            options and risks were discussed with the patient.                            All questions were answered, and informed consent                            was obtained. Prior Anticoagulants: The patient                            last took ibuprofen 1 day prior to the procedure.                            ASA Grade Assessment: II - A patient with mild                            systemic disease. After reviewing the risks and  benefits, the patient was deemed in satisfactory                            condition to undergo the procedure.                           After obtaining informed consent, the colonoscope                            was passed under direct vision. Throughout the                            procedure, the patient's blood pressure, pulse, and       oxygen saturations were monitored continuously. The                            [id number] ([id number]) was introduced through the                            anus and advanced to the the cecum, identified by                            appendiceal orifice and ileocecal valve. The                            colonoscopy was performed without difficulty. The                            patient tolerated the procedure well. The quality                            of the bowel preparation was good. The ileocecal                            valve, appendiceal orifice, and rectum were                            photographed. Scope In: 7:41:38 AM Scope Out: 8:02:39 AM Scope Withdrawal Time: 0 hours 17 minutes 0 seconds  Total Procedure Duration: 0 hours 21 minutes 1 second  Findings:      The perianal and digital rectal examinations were normal.      A 8 to 12 mm polyp was found in the proximal transverse colon. The polyp       was broad-based. The polyp was removed with a hot snare. Resection and       retrieval were complete. To prevent bleeding after the polypectomy, two       hemostatic clips were successfully placed (MR conditional). There was no       bleeding at the end of the procedure. Area was tattooed with an       injection of 2 mL of Spot (carbon black).      The exam was otherwise normal throughout the examined colon.      The retroflexed view of the distal rectum and anal verge was normal and       showed no  anal or rectal abnormalities. Impression:               - One 8 to 12 mm broad-based polyp in the proximal                            transverse colon, removed with a hot snare.                            Resected and retrieved. Clips (MR conditional) were                            placed. Tattooed. Moderate Sedation:      Moderate (conscious) sedation was administered by the endoscopy nurse       and supervised by the endoscopist. The following parameters were       monitored:  oxygen saturation, heart rate, blood pressure, CO2       capnography and response to care. Total physician intraservice time was       26 minutes. Recommendation:           - Patient has a contact number available for                            emergencies. The signs and symptoms of potential                            delayed complications were discussed with the                            patient. Return to normal activities tomorrow.                            Written discharge instructions were provided to the                            patient.                           - Resume previous diet today.                           - Continue present medications.                           - No aspirin, ibuprofen, naproxen, or other                            non-steroidal anti-inflammatory drugs for 1 week.                           - Await pathology results.                           - Repeat colonoscopy date to be determined after                            pending pathology results are  reviewed for                            surveillance. Procedure Code(s):        --- Professional ---                           [phone], Colonoscopy, flexible; with removal of                            tumor(s), polyp(s), or other lesion(s) by snare                            technique                           45381, Colonoscopy, flexible; with directed                            submucosal injection(s), any substance                           99152, Moderate sedation services provided by the                            same physician or other qualified health care                            professional performing the diagnostic or                            therapeutic service that the sedation supports,                            requiring the presence of an independent trained                            observer to assist in the monitoring of the                            patient's level of  consciousness and physiological                            status; initial 15 minutes of intraservice time,                            patient age 81 years or older                           [id number], Moderate sedation services; each additional                            15 minutes intraservice time Diagnosis Code(s):        --- Professional ---  Z12.11, Encounter for screening for malignant                            neoplasm of colon                           D12.3, Benign neoplasm of transverse colon (hepatic                            flexure or splenic flexure) CPT copyright 2016 American Medical Association. All rights reserved. The codes documented in this report are preliminary and upon coder review may  be revised to meet current compliance requirements. Hildred Laser, MD Hildred Laser, MD 02/12/2017 8:11:43 AM This report has been signed electronically. Number of Addenda: 0

## 2017-02-12 NOTE — Discharge Instructions (Signed)
Colonoscopy, Adult, Care After This sheet gives you information about how to care for yourself after your procedure. Your doctor may also give you more specific instructions. If you have problems or questions, call your doctor. Follow these instructions at home: General instructions   For the first 24 hours after the procedure: ? Do not drive or use machinery. ? Do not sign important documents. ? Do not drink alcohol. ? Do your daily activities more slowly than normal. ? Eat foods that are soft and easy to digest. ? Rest often.  Take over-the-counter or prescription medicines only as told by your doctor.  It is up to you to get the results of your procedure. Ask your doctor, or the department performing the procedure, when your results will be ready. To help cramping and bloating:  Try walking around.  Put heat on your belly (abdomen) as told by your doctor. Use a heat source that your doctor recommends, such as a moist heat pack or a heating pad. ? Put a towel between your skin and the heat source. ? Leave the heat on for 20-30 minutes. ? Remove the heat if your skin turns bright red. This is especially important if you cannot feel pain, heat, or cold. You can get burned. Eating and drinking  Drink enough fluid to keep your pee (urine) clear or pale yellow.  Return to your normal diet as told by your doctor. Avoid heavy or fried foods that are hard to digest.  Avoid drinking alcohol for as long as told by your doctor. Contact a doctor if:  You have blood in your poop (stool) 2-3 days after the procedure. Get help right away if:  You have more than a small amount of blood in your poop.  You see large clumps of tissue (blood clots) in your poop.  Your belly is swollen.  You feel sick to your stomach (nauseous).  You throw up (vomit).  You have a fever.  You have belly pain that gets worse, and medicine does not help your pain. This information is not intended to  replace advice given to you by your health care provider. Make sure you discuss any questions you have with your health care provider. Document Released: 07/19/2010 Document Revised: 03/10/2016 Document Reviewed: 03/10/2016 Elsevier Interactive Patient Education  2017 [employer].   Colon Polyps Polyps are tissue growths inside the body. Polyps can grow in many places, including the large intestine (colon). A polyp may be a round bump or a mushroom-shaped growth. You could have one polyp or several. Most colon polyps are noncancerous (benign). However, some colon polyps can become cancerous over time. What are the causes? The exact cause of colon polyps is not known. What increases the risk? This condition is more likely to develop in people who:  Have a family history of colon cancer or colon polyps.  Are older than 2 or older than 45 if they are African American.  Have inflammatory bowel disease, such as ulcerative colitis or Crohn disease.  Are overweight.  Smoke cigarettes.  Do not get enough exercise.  Drink too much alcohol.  Eat a diet that is: ? High in fat and red meat. ? Low in fiber.  Had childhood cancer that was treated with abdominal radiation.  What are the signs or symptoms? Most polyps do not cause symptoms. If you have symptoms, they may include:  Blood coming from your rectum when having a bowel movement.  Blood in your stool.The stool may  look dark red or black.  A change in bowel habits, such as constipation or diarrhea.  How is this diagnosed? This condition is diagnosed with a colonoscopy. This is a procedure that uses a lighted, flexible scope to look at the inside of your colon. How is this treated? Treatment for this condition involves removing any polyps that are found. Those polyps will then be tested for cancer. If cancer is found, your health care provider will talk to you about options for colon cancer treatment. Follow these  instructions at home: Diet  Eat plenty of fiber, such as fruits, vegetables, and whole grains.  Eat foods that are high in calcium and vitamin D, such as milk, cheese, yogurt, eggs, liver, fish, and broccoli.  Limit foods high in fat, red meats, and processed meats, such as hot dogs, sausage, bacon, and lunch meats.  Maintain a healthy weight, or lose weight if recommended by your health care provider. General instructions  Do not smoke cigarettes.  Do not drink alcohol excessively.  Keep all follow-up visits as told by your health care provider. This is important. This includes keeping regularly scheduled colonoscopies. Talk to your health care provider about when you need a colonoscopy.  Exercise every day or as told by your health care provider. Contact a health care provider if:  You have new or worsening bleeding during a bowel movement.  You have new or increased blood in your stool.  You have a change in bowel habits.  You unexpectedly lose weight. This information is not intended to replace advice given to you by your health care provider. Make sure you discuss any questions you have with your health care provider. Document Released: 03/12/2004 Document Revised: 11/22/2015 Document Reviewed: 05/07/2015 Elsevier Interactive Patient Education  2018 [employer]. No aspirin or NSAIDs for 1 week. Resume usual medications and diet. No driving for 24 hours. Physician will call with biopsy results.

## 2017-02-16 ENCOUNTER — Encounter (HOSPITAL_COMMUNITY): Payer: Self-pay | Admitting: Internal Medicine

## 2017-05-28 DIAGNOSIS — E782 Mixed hyperlipidemia: Secondary | ICD-10-CM | POA: Diagnosis not present

## 2017-05-28 DIAGNOSIS — R7303 Prediabetes: Secondary | ICD-10-CM | POA: Diagnosis not present

## 2017-05-28 DIAGNOSIS — K635 Polyp of colon: Secondary | ICD-10-CM | POA: Diagnosis not present

## 2017-05-28 DIAGNOSIS — I10 Essential (primary) hypertension: Secondary | ICD-10-CM | POA: Diagnosis not present

## 2017-09-09 DIAGNOSIS — R7303 Prediabetes: Secondary | ICD-10-CM | POA: Diagnosis not present

## 2017-09-09 DIAGNOSIS — E782 Mixed hyperlipidemia: Secondary | ICD-10-CM | POA: Diagnosis not present

## 2017-09-09 DIAGNOSIS — Z79899 Other long term (current) drug therapy: Secondary | ICD-10-CM | POA: Diagnosis not present

## 2017-09-09 DIAGNOSIS — I10 Essential (primary) hypertension: Secondary | ICD-10-CM | POA: Diagnosis not present

## 2017-12-08 ENCOUNTER — Other Ambulatory Visit: Payer: Self-pay | Admitting: Obstetrics & Gynecology

## 2017-12-08 DIAGNOSIS — Z1231 Encounter for screening mammogram for malignant neoplasm of breast: Secondary | ICD-10-CM

## 2017-12-14 DIAGNOSIS — R7303 Prediabetes: Secondary | ICD-10-CM | POA: Diagnosis not present

## 2017-12-14 DIAGNOSIS — Z79899 Other long term (current) drug therapy: Secondary | ICD-10-CM | POA: Diagnosis not present

## 2017-12-14 DIAGNOSIS — E782 Mixed hyperlipidemia: Secondary | ICD-10-CM | POA: Diagnosis not present

## 2017-12-14 DIAGNOSIS — I10 Essential (primary) hypertension: Secondary | ICD-10-CM | POA: Diagnosis not present

## 2018-01-14 ENCOUNTER — Ambulatory Visit (HOSPITAL_COMMUNITY)
Admission: RE | Admit: 2018-01-14 | Discharge: 2018-01-14 | Disposition: A | Discharge status: Home / Self Care | Payer: BLUE CROSS/BLUE SHIELD | Source: Ambulatory Visit | Attending: Obstetrics & Gynecology | Admitting: Obstetrics & Gynecology

## 2018-01-14 ENCOUNTER — Other Ambulatory Visit: Payer: Self-pay | Admitting: Obstetrics & Gynecology

## 2018-01-14 ENCOUNTER — Encounter (HOSPITAL_COMMUNITY): Payer: Self-pay

## 2018-01-14 ENCOUNTER — Other Ambulatory Visit: Payer: Self-pay

## 2018-01-14 ENCOUNTER — Encounter: Payer: Self-pay | Admitting: Obstetrics & Gynecology

## 2018-01-14 ENCOUNTER — Other Ambulatory Visit (HOSPITAL_COMMUNITY)
Admission: RE | Admit: 2018-01-14 | Discharge: 2018-01-14 | Disposition: A | Discharge status: Home / Self Care | Payer: BLUE CROSS/BLUE SHIELD | Source: Ambulatory Visit | Attending: Obstetrics & Gynecology | Admitting: Obstetrics & Gynecology

## 2018-01-14 ENCOUNTER — Ambulatory Visit (INDEPENDENT_AMBULATORY_CARE_PROVIDER_SITE_OTHER): Payer: BLUE CROSS/BLUE SHIELD | Admitting: Obstetrics & Gynecology

## 2018-01-14 VITALS — BP 157/79 | HR 89 | Ht 61.0 in | Wt 200.0 lb

## 2018-01-14 DIAGNOSIS — Z01419 Encounter for gynecological examination (general) (routine) without abnormal findings: Secondary | ICD-10-CM

## 2018-01-14 DIAGNOSIS — Z1231 Encounter for screening mammogram for malignant neoplasm of breast: Secondary | ICD-10-CM | POA: Insufficient documentation

## 2018-01-14 DIAGNOSIS — Z1211 Encounter for screening for malignant neoplasm of colon: Secondary | ICD-10-CM

## 2018-01-14 DIAGNOSIS — Z1212 Encounter for screening for malignant neoplasm of rectum: Secondary | ICD-10-CM

## 2018-01-14 LAB — HEMOCCULT GUIAC POC 1CARD (OFFICE): FECAL OCCULT BLD: NEGATIVE

## 2018-01-14 NOTE — Addendum Note (Signed)
Addended by: Gaylyn Rong A on: 01/14/2018 12:19 PM   Modules accepted: Orders

## 2018-01-14 NOTE — Progress Notes (Signed)
Subjective:     Annette Washington is a 61 y.o. female here for a routine exam.  No LMP recorded. Patient is postmenopausal. [id number] Birth Control Method:  postmenopausal Menstrual Calendar(currently): amenorrheic  Current complaints: none.   Current acute medical issues:  Hypertension diabetes   Recent Gynecologic History No LMP recorded. Patient is postmenopausal. Last Pap: 2018,  normal Last mammogram: today,  pending  Past Medical History:  Diagnosis Date  . Diabetes mellitus without complication ([place])   . Hypertension   . Pneumonia     Past Surgical History:  Procedure Laterality Date  . COLONOSCOPY N/A 02/12/2017   Procedure: COLONOSCOPY;  Surgeon: Rogene Houston, MD;  Location: [facility];  Service: Endoscopy;  Laterality: N/A;  730  . TONSILLECTOMY      OB History    Gravida  3   Para  3   Term  3   Preterm      AB      Living  2     SAB      TAB      Ectopic      Multiple      Live Births  2           Social History   Socioeconomic History  . Marital status: Married    Spouse name: Not on file  . Number of children: Not on file  . Years of education: Not on file  . Highest education level: Not on file  Occupational History  . Not on file  Social Needs  . Financial resource strain: Not on file  . Food insecurity:    Worry: Not on file    Inability: Not on file  . Transportation needs:    Medical: Not on file    Non-medical: Not on file  Tobacco Use  . Smoking status: Former Smoker    Types: Cigarettes  . Smokeless tobacco: Never Used  Substance and Sexual Activity  . Alcohol use: No  . Drug use: No  . Sexual activity: Yes    Birth control/protection: Post-menopausal  Lifestyle  . Physical activity:    Days per week: Not on file    Minutes per session: Not on file  . Stress: Not on file  Relationships  . Social connections:    Talks on phone: Not on file    Gets together: Not on file    Attends religious service:  Not on file    Active member of club or organization: Not on file    Attends meetings of clubs or organizations: Not on file    Relationship status: Not on file  Other Topics Concern  . Not on file  Social History Narrative  . Not on file    Family History  Problem Relation Age of Onset  . Kidney disease Mother   . Breast cancer Maternal Grandmother      Current Outpatient Medications:  .  amLODipine (NORVASC) 5 MG tablet, Take 5 mg by mouth daily., Disp: , Rfl:  .  cetirizine (ZYRTEC) 10 MG tablet, Take 10 mg by mouth daily as needed for allergies., Disp: , Rfl:  .  ibuprofen (ADVIL,MOTRIN) 200 MG tablet, Take 600 mg by mouth daily as needed for headache or moderate pain. , Disp: , Rfl:  .  losartan-hydrochlorothiazide (HYZAAR) 50-12.5 MG per tablet, Take 1 tablet by mouth daily., Disp: , Rfl:  .  metFORMIN (GLUCOPHAGE) 500 MG tablet, Take 500 mg by mouth every evening. , Disp: ,  Rfl:  .  pravastatin (PRAVACHOL) 10 MG tablet, Take 10 mg by mouth daily., Disp: , Rfl:   Review of Systems  Review of Systems  Constitutional: Negative for fever, chills, weight loss, malaise/fatigue and diaphoresis.  HENT: Negative for hearing loss, ear pain, nosebleeds, congestion, sore throat, neck pain, tinnitus and ear discharge.   Eyes: Negative for blurred vision, double vision, photophobia, pain, discharge and redness.  Respiratory: Negative for cough, hemoptysis, sputum production, shortness of breath, wheezing and stridor.   Cardiovascular: Negative for chest pain, palpitations, orthopnea, claudication, leg swelling and PND.  Gastrointestinal: negative for abdominal pain. Negative for heartburn, nausea, vomiting, diarrhea, constipation, blood in stool and melena.  Genitourinary: Negative for dysuria, urgency, frequency, hematuria and flank pain.  Musculoskeletal: Negative for myalgias, back pain, joint pain and falls.  Skin: Negative for itching and rash.  Neurological: Negative for  dizziness, tingling, tremors, sensory change, speech change, focal weakness, seizures, loss of consciousness, weakness and headaches.  Endo/Heme/Allergies: Negative for environmental allergies and polydipsia. Does not bruise/bleed easily.  Psychiatric/Behavioral: Negative for depression, suicidal ideas, hallucinations, memory loss and substance abuse. The patient is not nervous/anxious and does not have insomnia.        Objective:  There were no vitals taken for this visit.   Physical Exam  Vitals reviewed. Constitutional: She is oriented to person, place, and time. She appears well-developed and well-nourished.  HENT:  Head: Normocephalic and atraumatic.        Right Ear: External ear normal.  Left Ear: External ear normal.  Nose: Nose normal.  Mouth/Throat: Oropharynx is clear and moist.  Eyes: Conjunctivae and EOM are normal. Pupils are equal, round, and reactive to light. Right eye exhibits no discharge. Left eye exhibits no discharge. No scleral icterus.  Neck: Normal range of motion. Neck supple. No tracheal deviation present. No thyromegaly present.  Cardiovascular: Normal rate, regular rhythm, normal heart sounds and intact distal pulses.  Exam reveals no gallop and no friction rub.   No murmur heard. Respiratory: Effort normal and breath sounds normal. No respiratory distress. She has no wheezes. She has no rales. She exhibits no tenderness.  GI: Soft. Bowel sounds are normal. She exhibits no distension and no mass. There is no tenderness. There is no rebound and no guarding.  Genitourinary:  Breasts no masses skin changes or nipple changes bilaterally      Vulva is normal without lesions Vagina is pink moist without discharge Cervix normal in appearance and pap is done Uterus is normal size shape and contour Adnexa is negative with normal sized ovaries  {Rectal    hemoccult negative, normal tone, no masses   Musculoskeletal: Normal range of motion. She exhibits no edema and  no tenderness.  Neurological: She is alert and oriented to person, place, and time. She has normal reflexes. She displays normal reflexes. No cranial nerve deficit. She exhibits normal muscle tone. Coordination normal.  Skin: Skin is warm and dry. No rash noted. No erythema. No pallor.  Psychiatric: She has a normal mood and affect. Her behavior is normal. Judgment and thought content normal.       Medications Ordered at today's visit: No orders of the defined types were placed in this encounter.   Other orders placed at today's visit: No orders of the defined types were placed in this encounter.     Assessment:    Healthy female exam.   hypertension  diabetes Plan:    Mammogram ordered. Follow up in: 3 years.  Return in about 3 years (around 01/14/2021) for yearly, with Dr Elonda Husky.

## 2018-01-15 LAB — CYTOLOGY - PAP
Diagnosis: NEGATIVE
HPV: NOT DETECTED

## 2018-03-08 DIAGNOSIS — E119 Type 2 diabetes mellitus without complications: Secondary | ICD-10-CM | POA: Diagnosis not present

## 2018-03-08 DIAGNOSIS — H25812 Combined forms of age-related cataract, left eye: Secondary | ICD-10-CM | POA: Diagnosis not present

## 2018-03-08 DIAGNOSIS — H25813 Combined forms of age-related cataract, bilateral: Secondary | ICD-10-CM | POA: Diagnosis not present

## 2018-03-30 NOTE — Patient Instructions (Signed)
Your procedure is scheduled on: 04/12/2018  Report to [facility] at   800  AM.  Call this number if you have problems the morning of surgery: [phone]   Do not eat food or drink liquids :After Midnight.      Take these medicines the morning of surgery with A SIP OF WATER: amlodipine, zyrtec, losartan.   Do not wear jewelry, make-up or nail polish.  Do not wear lotions, powders, or perfumes. You may wear deodorant.  Do not shave 48 hours prior to surgery.  Do not bring valuables to the hospital.  Contacts, dentures or bridgework may not be worn into surgery.  Leave suitcase in the car. After surgery it may be brought to your room.  For patients admitted to the hospital, checkout time is 11:00 AM the day of discharge.   Patients discharged the day of surgery will not be allowed to drive home.  :     Please read over the following fact sheets that you were given: Coughing and Deep Breathing, Surgical Site Infection Prevention, Anesthesia Post-op Instructions and Care and Recovery After Surgery    Cataract A cataract is a clouding of the lens of the eye. When a lens becomes cloudy, vision is reduced based on the degree and nature of the clouding. Many cataracts reduce vision to some degree. Some cataracts make people more near-sighted as they develop. Other cataracts increase glare. Cataracts that are ignored and become worse can sometimes look white. The white color can be seen through the pupil. CAUSES   Aging. However, cataracts may occur at any age, even in newborns.   Certain drugs.   Trauma to the eye.   Certain diseases such as diabetes.   Specific eye diseases such as chronic inflammation inside the eye or a sudden attack of a rare form of glaucoma.   Inherited or acquired medical problems.  SYMPTOMS   Gradual, progressive drop in vision in the affected eye.   Severe, rapid visual loss. This most often happens when trauma is the cause.  DIAGNOSIS  To detect a  cataract, an eye doctor examines the lens. Cataracts are best diagnosed with an exam of the eyes with the pupils enlarged (dilated) by drops.  TREATMENT  For an early cataract, vision may improve by using different eyeglasses or stronger lighting. If that does not help your vision, surgery is the only effective treatment. A cataract needs to be surgically removed when vision loss interferes with your everyday activities, such as driving, reading, or watching TV. A cataract may also have to be removed if it prevents examination or treatment of another eye problem. Surgery removes the cloudy lens and usually replaces it with a substitute lens (intraocular lens, IOL).  At a time when both you and your doctor agree, the cataract will be surgically removed. If you have cataracts in both eyes, only one is usually removed at a time. This allows the operated eye to heal and be out of danger from any possible problems after surgery (such as infection or poor wound healing). In rare cases, a cataract may be doing damage to your eye. In these cases, your caregiver may advise surgical removal right away. The vast majority of people who have cataract surgery have better vision afterward. HOME CARE INSTRUCTIONS  If you are not planning surgery, you may be asked to do the following:  Use different eyeglasses.   Use stronger or brighter lighting.   Ask your eye doctor about reducing your  medicine dose or changing medicines if it is thought that a medicine caused your cataract. Changing medicines does not make the cataract go away on its own.   Become familiar with your surroundings. Poor vision can lead to injury. Avoid bumping into things on the affected side. You are at a higher risk for tripping or falling.   Exercise extreme care when driving or operating machinery.   Wear sunglasses if you are sensitive to bright light or experiencing problems with glare.  SEEK IMMEDIATE MEDICAL CARE IF:   You have a  worsening or sudden vision loss.   You notice redness, swelling, or increasing pain in the eye.   You have a fever.  Document Released: 06/16/2005 Document Revised: 06/05/2011 Document Reviewed: 02/07/2011 [facility] Patient Information 2012 [place].PATIENT INSTRUCTIONS POST-ANESTHESIA  IMMEDIATELY FOLLOWING SURGERY:  Do not drive or operate machinery for the first twenty four hours after surgery.  Do not make any important decisions for twenty four hours after surgery or while taking narcotic pain medications or sedatives.  If you develop intractable nausea and vomiting or a severe headache please notify your doctor immediately.  FOLLOW-UP:  Please make an appointment with your surgeon as instructed. You do not need to follow up with anesthesia unless specifically instructed to do so.  WOUND CARE INSTRUCTIONS (if applicable):  Keep a dry clean dressing on the anesthesia/puncture wound site if there is drainage.  Once the wound has quit draining you may leave it open to air.  Generally you should leave the bandage intact for twenty four hours unless there is drainage.  If the epidural site drains for more than 36-48 hours please call the anesthesia department.  QUESTIONS?:  Please feel free to call your physician or the hospital operator if you have any questions, and they will be happy to assist you.

## 2018-04-02 ENCOUNTER — Other Ambulatory Visit: Payer: Self-pay

## 2018-04-02 ENCOUNTER — Encounter (HOSPITAL_COMMUNITY): Payer: Self-pay

## 2018-04-02 ENCOUNTER — Encounter (HOSPITAL_COMMUNITY)
Admission: RE | Admit: 2018-04-02 | Discharge: 2018-04-02 | Disposition: A | Discharge status: Home / Self Care | Payer: BLUE CROSS/BLUE SHIELD | Source: Ambulatory Visit | Attending: Ophthalmology | Admitting: Ophthalmology

## 2018-04-02 DIAGNOSIS — Z01818 Encounter for other preprocedural examination: Secondary | ICD-10-CM | POA: Insufficient documentation

## 2018-04-02 LAB — BASIC METABOLIC PANEL
ANION GAP: 10 (ref 5–15)
BUN: 17 mg/dL (ref 8–23)
CALCIUM: 9.8 mg/dL (ref 8.9–10.3)
CHLORIDE: 105 mmol/L (ref 98–111)
CO2: 26 mmol/L (ref 22–32)
Creatinine, Ser: 0.79 mg/dL (ref 0.44–1.00)
GFR calc non Af Amer: >60 mL/min (ref >60)
GLUCOSE: 155 mg/dL — AB (ref 70–99)
POTASSIUM: 3.5 mmol/L (ref 3.5–5.1)
Sodium: 141 mmol/L (ref 135–145)

## 2018-04-02 LAB — CBC WITH DIFFERENTIAL/PLATELET
BASOS ABS: 0 10*3/uL (ref 0.0–0.1)
BASOS PCT: 0 %
Eosinophils Absolute: 0.1 10*3/uL (ref 0.0–0.7)
Eosinophils Relative: 1 %
HEMATOCRIT: 40.5 % (ref 36.0–46.0)
HEMOGLOBIN: 13.4 g/dL (ref 12.0–15.0)
LYMPHS PCT: 25 %
Lymphs Abs: 2.5 10*3/uL (ref 0.7–4.0)
MCH: 30 pg (ref 26.0–34.0)
MCHC: 33.1 g/dL (ref 30.0–36.0)
MCV: 90.8 fL (ref 78.0–100.0)
Monocytes Absolute: 0.4 10*3/uL (ref 0.1–1.0)
Monocytes Relative: 4 %
NEUTROS ABS: 7.1 10*3/uL (ref 1.7–7.7)
NEUTROS PCT: 70 %
Platelets: 273 10*3/uL (ref 150–400)
RBC: 4.46 MIL/uL (ref 3.87–5.11)
RDW: 13.1 % (ref 11.5–15.5)
WBC: 10.2 10*3/uL (ref 4.0–10.5)

## 2018-04-02 LAB — GLUCOSE, CAPILLARY: Glucose-Capillary: 144 mg/dL — ABNORMAL HIGH (ref 70–99)

## 2018-04-03 LAB — HEMOGLOBIN A1C
Hgb A1c MFr Bld: 5.9 % — ABNORMAL HIGH (ref 4.8–5.6)
Mean Plasma Glucose: 123 mg/dL

## 2018-04-12 ENCOUNTER — Encounter (HOSPITAL_COMMUNITY)
Admission: RE | Disposition: A | Discharge status: Home / Self Care | Payer: Self-pay | Source: Ambulatory Visit | Attending: Ophthalmology

## 2018-04-12 ENCOUNTER — Ambulatory Visit (HOSPITAL_COMMUNITY)
Admission: RE | Admit: 2018-04-12 | Discharge: 2018-04-12 | Disposition: A | Discharge status: Home / Self Care | Payer: BLUE CROSS/BLUE SHIELD | Source: Ambulatory Visit | Attending: Ophthalmology | Admitting: Ophthalmology

## 2018-04-12 ENCOUNTER — Ambulatory Visit (HOSPITAL_COMMUNITY): Payer: BLUE CROSS/BLUE SHIELD | Admitting: Anesthesiology

## 2018-04-12 ENCOUNTER — Encounter (HOSPITAL_COMMUNITY): Payer: Self-pay | Admitting: Anesthesiology

## 2018-04-12 DIAGNOSIS — Z87891 Personal history of nicotine dependence: Secondary | ICD-10-CM | POA: Diagnosis not present

## 2018-04-12 DIAGNOSIS — H25812 Combined forms of age-related cataract, left eye: Secondary | ICD-10-CM | POA: Insufficient documentation

## 2018-04-12 DIAGNOSIS — H2512 Age-related nuclear cataract, left eye: Secondary | ICD-10-CM | POA: Diagnosis not present

## 2018-04-12 DIAGNOSIS — E1136 Type 2 diabetes mellitus with diabetic cataract: Secondary | ICD-10-CM | POA: Insufficient documentation

## 2018-04-12 DIAGNOSIS — Z79899 Other long term (current) drug therapy: Secondary | ICD-10-CM | POA: Diagnosis not present

## 2018-04-12 DIAGNOSIS — I10 Essential (primary) hypertension: Secondary | ICD-10-CM | POA: Insufficient documentation

## 2018-04-12 DIAGNOSIS — Z7984 Long term (current) use of oral hypoglycemic drugs: Secondary | ICD-10-CM | POA: Insufficient documentation

## 2018-04-12 HISTORY — PX: CATARACT EXTRACTION W/PHACO: SHX586

## 2018-04-12 LAB — GLUCOSE, CAPILLARY: GLUCOSE-CAPILLARY: 91 mg/dL (ref 70–99)

## 2018-04-12 SURGERY — PHACOEMULSIFICATION, CATARACT, WITH IOL INSERTION
Anesthesia: Monitor Anesthesia Care | Site: Eye | Laterality: Left

## 2018-04-12 MED ORDER — POVIDONE-IODINE 5 % OP SOLN
OPHTHALMIC | Status: DC | PRN
  Administered 2018-04-12: 1 via OPHTHALMIC

## 2018-04-12 MED ORDER — PROVISC 10 MG/ML IO SOLN
INTRAOCULAR | Status: DC | PRN
  Administered 2018-04-12: 0.85 mL via INTRAOCULAR

## 2018-04-12 MED ORDER — EPINEPHRINE PF 1 MG/ML IJ SOLN
INTRAOCULAR | Status: DC | PRN
  Administered 2018-04-12: 500 mL

## 2018-04-12 MED ORDER — LIDOCAINE HCL (PF) 1 % IJ SOLN
INTRAOCULAR | Status: DC | PRN
  Administered 2018-04-12: .8 mL via OPHTHALMIC

## 2018-04-12 MED ORDER — LIDOCAINE HCL 3.5 % OP GEL
1.0000 "application " | Freq: Once | OPHTHALMIC | Status: AC
  Administered 2018-04-12: 1 via OPHTHALMIC

## 2018-04-12 MED ORDER — EPINEPHRINE PF 1 MG/ML IJ SOLN
INTRAMUSCULAR | Status: AC
  Filled 2018-04-12: qty 2

## 2018-04-12 MED ORDER — MIDAZOLAM HCL 5 MG/5ML IJ SOLN
INTRAMUSCULAR | Status: DC | PRN
  Administered 2018-04-12: 2 mg via INTRAVENOUS

## 2018-04-12 MED ORDER — NEOMYCIN-POLYMYXIN-DEXAMETH 3.5-10000-0.1 OP SUSP
OPHTHALMIC | Status: DC | PRN
  Administered 2018-04-12: 2 [drp] via OPHTHALMIC

## 2018-04-12 MED ORDER — BSS IO SOLN
INTRAOCULAR | Status: DC | PRN
  Administered 2018-04-12: 15 mL

## 2018-04-12 MED ORDER — LACTATED RINGERS IV SOLN
INTRAVENOUS | Status: DC
  Administered 2018-04-12: 09:00:00 via INTRAVENOUS

## 2018-04-12 MED ORDER — TETRACAINE HCL 0.5 % OP SOLN
1.0000 [drp] | OPHTHALMIC | Status: AC
  Administered 2018-04-12 (×3): 1 [drp] via OPHTHALMIC

## 2018-04-12 MED ORDER — MIDAZOLAM HCL 2 MG/2ML IJ SOLN
INTRAMUSCULAR | Status: AC
  Filled 2018-04-12: qty 2

## 2018-04-12 MED ORDER — PHENYLEPHRINE HCL 2.5 % OP SOLN
1.0000 [drp] | OPHTHALMIC | Status: AC
  Administered 2018-04-12 (×3): 1 [drp] via OPHTHALMIC

## 2018-04-12 MED ORDER — CYCLOPENTOLATE-PHENYLEPHRINE 0.2-1 % OP SOLN
1.0000 [drp] | OPHTHALMIC | Status: AC
  Administered 2018-04-12 (×3): 1 [drp] via OPHTHALMIC

## 2018-04-12 SURGICAL SUPPLY — 12 items
CLOTH BEACON ORANGE TIMEOUT ST (SAFETY) ×2 IMPLANT
EYE SHIELD UNIVERSAL CLEAR (GAUZE/BANDAGES/DRESSINGS) ×2 IMPLANT
GLOVE BIOGEL PI IND STRL 7.0 (GLOVE) IMPLANT
GLOVE BIOGEL PI INDICATOR 7.0 (GLOVE) ×4
LENS ALC ACRYL/TECN (Ophthalmic Related) ×2 IMPLANT
NDL HYPO 18GX1.5 BLUNT FILL (NEEDLE) IMPLANT
NEEDLE HYPO 18GX1.5 BLUNT FILL (NEEDLE) ×3 IMPLANT
PAD ARMBOARD 7.5X6 YLW CONV (MISCELLANEOUS) ×2 IMPLANT
SYRINGE LUER LOK 1CC (MISCELLANEOUS) ×2 IMPLANT
TAPE SURG TRANSPORE 1 IN (GAUZE/BANDAGES/DRESSINGS) IMPLANT
TAPE SURGICAL TRANSPORE 1 IN (GAUZE/BANDAGES/DRESSINGS) ×2
WATER STERILE IRR 250ML POUR (IV SOLUTION) ×2 IMPLANT

## 2018-04-12 NOTE — Anesthesia Preprocedure Evaluation (Signed)
Anesthesia Evaluation  Patient identified by MRN, date of birth, ID band Patient awake    Reviewed: Allergy & Precautions, H&P , NPO status , Patient's Chart, lab work & pertinent test results, reviewed documented beta blocker date and time   Airway Mallampati: II  TM Distance: >3 FB Neck ROM: full    Dental  (+) Edentulous Upper   Pulmonary pneumonia, former smoker,    Pulmonary exam normal breath sounds clear to auscultation       Cardiovascular Exercise Tolerance: Good hypertension, negative cardio ROS   Rhythm:regular Rate:Normal     Neuro/Psych negative neurological ROS  negative psych ROS   GI/Hepatic negative GI ROS, Neg liver ROS,   Endo/Other  negative endocrine ROSdiabetes  Renal/GU negative Renal ROS  negative genitourinary   Musculoskeletal   Abdominal   Peds  Hematology negative hematology ROS (+)   Anesthesia Other Findings   Reproductive/Obstetrics negative OB ROS                             Anesthesia Physical Anesthesia Plan  ASA: II  Anesthesia Plan: MAC   Post-op Pain Management:    Induction:   PONV Risk Score and Plan:   Airway Management Planned:   Additional Equipment:   Intra-op Plan:   Post-operative Plan:   Informed Consent: I have reviewed the patients History and Physical, chart, labs and discussed the procedure including the risks, benefits and alternatives for the proposed anesthesia with the patient or authorized representative who has indicated his/her understanding and acceptance.     Plan Discussed with: CRNA  Anesthesia Plan Comments:         Anesthesia Quick Evaluation

## 2018-04-12 NOTE — Anesthesia Postprocedure Evaluation (Signed)
Anesthesia Post Note  Patient: NATIVIDAD SCHLOSSER  Procedure(s) Performed: CATARACT EXTRACTION PHACO AND INTRAOCULAR LENS PLACEMENT ([place]) (Left Eye)  Patient location during evaluation: Short Stay Anesthesia Type: MAC Level of consciousness: awake and alert and oriented Pain management: pain level controlled Respiratory status: spontaneous breathing Cardiovascular status: blood pressure returned to baseline and stable Postop Assessment: no apparent nausea or vomiting Anesthetic complications: no     Last Vitals:  Vitals:   04/12/18 0900 04/12/18 0915  BP: (!) 169/79   Resp: 20 17  Temp: 36.8 C   SpO2: 99% 100%    Last Pain:  Vitals:   04/12/18 0900  PainSc: 0-No pain                 Nader Boys

## 2018-04-12 NOTE — H&P (Signed)
I have reviewed the H&P, the patient was re-examined, and I have identified no interval changes in medical condition and plan of care since the history and physical of record  

## 2018-04-12 NOTE — Discharge Instructions (Signed)

## 2018-04-12 NOTE — Transfer of Care (Addendum)
Immediate Anesthesia Transfer of Care Note  Patient: Annette Washington  Procedure(s) Performed: CATARACT EXTRACTION PHACO AND INTRAOCULAR LENS PLACEMENT ([facility]) (Left Eye)  Patient Location: Short Stay  Anesthesia Type:MAC  Level of Consciousness: awake  Airway & Oxygen Therapy: Patient Spontanous Breathing  Post-op Assessment: Report given to RN  Post vital signs: Reviewed  Last Vitals:  Vitals Value Taken Time  BP    Temp    Pulse    Resp    SpO2      Last Pain:  Vitals:   04/12/18 0900  PainSc: 0-No pain         Complications: No apparent anesthesia complications

## 2018-04-12 NOTE — Op Note (Signed)
Date of Admission: 04/12/2018  Date of Surgery: 04/12/2018  Pre-Op Dx: Cataract Left  Eye  Post-Op Dx: Senile Combined Cataract  Left  Eye,  Dx Code X51.700  Surgeon: Tonny Branch, M.D.  Assistants: None  Anesthesia: Topical with MAC  Indications: Painless, progressive loss of vision with compromise of daily activities.  Surgery: Cataract Extraction with Intraocular lens Implant Left Eye  Discription: The patient had dilating drops and viscous lidocaine placed into the Left eye in the pre-op holding area. After transfer to the operating room, a time out was performed. The patient was then prepped and draped. Beginning with a 41m blade a paracentesis port was made at the surgeon's 2 o'clock position. The anterior chamber was then filled with 1% non-preserved lidocaine. This was followed by filling the anterior chamber with Provisc.  A 2.446mkeratome blade was used to make a clear corneal incision at the temporal limbus.  A bent cystatome needle was used to create a continuous tear capsulotomy. Hydrodissection was performed with balanced salt solution on a Fine canula. The lens nucleus was then removed using the phacoemulsification handpiece. Residual cortex was removed with the I&A handpiece. The anterior chamber and capsular bag were refilled with Provisc. A posterior chamber intraocular lens was placed into the capsular bag with it's injector. The implant was positioned with the Kuglan hook. The Provisc was then removed from the anterior chamber and capsular bag with the I&A handpiece. Stromal hydration of the main incision and paracentesis port was performed with BSS on a Fine canula. The wounds were tested for leak which was negative. The patient tolerated the procedure well. There were no operative complications. The patient was then transferred to the recovery room in stable condition.  Complications: None  Specimen: None  EBL: None  Prosthetic device: J&J Technis, PCB00, power 23.0, SN  [id number]

## 2018-04-13 ENCOUNTER — Encounter (HOSPITAL_COMMUNITY): Payer: Self-pay | Admitting: Ophthalmology

## 2018-04-15 DIAGNOSIS — I10 Essential (primary) hypertension: Secondary | ICD-10-CM | POA: Diagnosis not present

## 2018-04-15 DIAGNOSIS — Z79899 Other long term (current) drug therapy: Secondary | ICD-10-CM | POA: Diagnosis not present

## 2018-04-15 DIAGNOSIS — R7303 Prediabetes: Secondary | ICD-10-CM | POA: Diagnosis not present

## 2018-04-15 DIAGNOSIS — E782 Mixed hyperlipidemia: Secondary | ICD-10-CM | POA: Diagnosis not present

## 2018-04-19 DIAGNOSIS — H25811 Combined forms of age-related cataract, right eye: Secondary | ICD-10-CM | POA: Diagnosis not present

## 2018-04-21 ENCOUNTER — Encounter (HOSPITAL_COMMUNITY): Payer: Self-pay

## 2018-04-21 ENCOUNTER — Encounter (HOSPITAL_COMMUNITY)
Admission: RE | Admit: 2018-04-21 | Discharge: 2018-04-21 | Disposition: A | Discharge status: Home / Self Care | Payer: BLUE CROSS/BLUE SHIELD | Source: Ambulatory Visit | Attending: Ophthalmology | Admitting: Ophthalmology

## 2018-04-26 ENCOUNTER — Encounter (HOSPITAL_COMMUNITY)
Admission: RE | Disposition: A | Discharge status: Home / Self Care | Payer: Self-pay | Source: Ambulatory Visit | Attending: Ophthalmology

## 2018-04-26 ENCOUNTER — Ambulatory Visit (HOSPITAL_COMMUNITY): Payer: BLUE CROSS/BLUE SHIELD | Admitting: Anesthesiology

## 2018-04-26 ENCOUNTER — Encounter (HOSPITAL_COMMUNITY): Payer: Self-pay | Admitting: Anesthesiology

## 2018-04-26 ENCOUNTER — Ambulatory Visit (HOSPITAL_COMMUNITY)
Admission: RE | Admit: 2018-04-26 | Discharge: 2018-04-26 | Disposition: A | Discharge status: Home / Self Care | Payer: BLUE CROSS/BLUE SHIELD | Source: Ambulatory Visit | Attending: Ophthalmology | Admitting: Ophthalmology

## 2018-04-26 DIAGNOSIS — H25811 Combined forms of age-related cataract, right eye: Secondary | ICD-10-CM | POA: Insufficient documentation

## 2018-04-26 DIAGNOSIS — I10 Essential (primary) hypertension: Secondary | ICD-10-CM | POA: Diagnosis not present

## 2018-04-26 DIAGNOSIS — Z87891 Personal history of nicotine dependence: Secondary | ICD-10-CM | POA: Insufficient documentation

## 2018-04-26 DIAGNOSIS — E1136 Type 2 diabetes mellitus with diabetic cataract: Secondary | ICD-10-CM | POA: Diagnosis not present

## 2018-04-26 DIAGNOSIS — H2511 Age-related nuclear cataract, right eye: Secondary | ICD-10-CM | POA: Diagnosis not present

## 2018-04-26 HISTORY — PX: CATARACT EXTRACTION W/PHACO: SHX586

## 2018-04-26 LAB — GLUCOSE, CAPILLARY: Glucose-Capillary: 83 mg/dL (ref 70–99)

## 2018-04-26 SURGERY — PHACOEMULSIFICATION, CATARACT, WITH IOL INSERTION
Anesthesia: Monitor Anesthesia Care | Site: Eye | Laterality: Right

## 2018-04-26 MED ORDER — PHENYLEPHRINE HCL 2.5 % OP SOLN
1.0000 [drp] | OPHTHALMIC | Status: AC | PRN
  Administered 2018-04-26 (×3): 1 [drp] via OPHTHALMIC

## 2018-04-26 MED ORDER — EPINEPHRINE PF 1 MG/ML IJ SOLN
INTRAOCULAR | Status: DC | PRN
  Administered 2018-04-26: 500 mL

## 2018-04-26 MED ORDER — LACTATED RINGERS IV SOLN
INTRAVENOUS | Status: DC | PRN
  Administered 2018-04-26: 10:00:00 via INTRAVENOUS

## 2018-04-26 MED ORDER — TETRACAINE HCL 0.5 % OP SOLN
1.0000 [drp] | OPHTHALMIC | Status: AC | PRN
  Administered 2018-04-26 (×3): 1 [drp] via OPHTHALMIC

## 2018-04-26 MED ORDER — EPINEPHRINE PF 1 MG/ML IJ SOLN
INTRAMUSCULAR | Status: AC
  Filled 2018-04-26: qty 1

## 2018-04-26 MED ORDER — BSS IO SOLN
INTRAOCULAR | Status: DC | PRN
  Administered 2018-04-26: 15 mL

## 2018-04-26 MED ORDER — CYCLOPENTOLATE-PHENYLEPHRINE 0.2-1 % OP SOLN
1.0000 [drp] | OPHTHALMIC | Status: AC | PRN
  Administered 2018-04-26 (×3): 1 [drp] via OPHTHALMIC

## 2018-04-26 MED ORDER — LIDOCAINE HCL 3.5 % OP GEL
1.0000 "application " | Freq: Once | OPHTHALMIC | Status: AC
  Administered 2018-04-26: 1 via OPHTHALMIC

## 2018-04-26 MED ORDER — NEOMYCIN-POLYMYXIN-DEXAMETH 3.5-10000-0.1 OP SUSP
OPHTHALMIC | Status: DC | PRN
  Administered 2018-04-26: 1 [drp] via OPHTHALMIC

## 2018-04-26 MED ORDER — PROVISC 10 MG/ML IO SOLN
INTRAOCULAR | Status: DC | PRN
  Administered 2018-04-26: 0.85 mL via INTRAOCULAR

## 2018-04-26 MED ORDER — MIDAZOLAM HCL 2 MG/2ML IJ SOLN
INTRAMUSCULAR | Status: AC
  Filled 2018-04-26: qty 2

## 2018-04-26 MED ORDER — POVIDONE-IODINE 5 % OP SOLN
OPHTHALMIC | Status: DC | PRN
  Administered 2018-04-26: 1 via OPHTHALMIC

## 2018-04-26 MED ORDER — LIDOCAINE HCL (PF) 1 % IJ SOLN
INTRAMUSCULAR | Status: DC | PRN
  Administered 2018-04-26: .4 mL

## 2018-04-26 MED ORDER — MIDAZOLAM HCL 5 MG/5ML IJ SOLN
INTRAMUSCULAR | Status: DC | PRN
  Administered 2018-04-26: 2 mg via INTRAVENOUS

## 2018-04-26 SURGICAL SUPPLY — 12 items

## 2018-04-26 NOTE — Anesthesia Postprocedure Evaluation (Signed)
Anesthesia Post Note  Patient: Annette Washington  Procedure(s) Performed: CATARACT EXTRACTION PHACO AND INTRAOCULAR LENS PLACEMENT RIGHT EYE CDE=6.51 (Right Eye)  Patient location during evaluation: PACU Anesthesia Type: MAC Level of consciousness: awake and alert and oriented Pain management: pain level controlled Vital Signs Assessment: post-procedure vital signs reviewed and stable Respiratory status: spontaneous breathing Cardiovascular status: blood pressure returned to baseline and stable Postop Assessment: no apparent nausea or vomiting Anesthetic complications: no     Last Vitals:  Vitals:   04/26/18 0944  Pulse: 66  Resp: 16  Temp: 37 C  SpO2: 96%    Last Pain:  Vitals:   04/26/18 0944  TempSrc: Oral  PainSc: 0-No pain                 Annette Washington

## 2018-04-26 NOTE — Anesthesia Preprocedure Evaluation (Signed)
Anesthesia Evaluation  Patient identified by MRN, date of birth, ID band Patient awake    Reviewed: Allergy & Precautions, NPO status , Patient's Chart, lab work & pertinent test results  Airway Mallampati: II  TM Distance: >3 FB Neck ROM: Full    Dental no notable dental hx. (+) Edentulous Upper, Edentulous Lower   Pulmonary neg pulmonary ROS, former smoker,    Pulmonary exam normal breath sounds clear to auscultation       Cardiovascular Exercise Tolerance: Good hypertension, Pt. on medications negative cardio ROS Normal cardiovascular examI Rhythm:Regular Rate:Normal     Neuro/Psych negative neurological ROS  negative psych ROS   GI/Hepatic negative GI ROS, Neg liver ROS,   Endo/Other  negative endocrine ROSdiabetes, Well Controlled, Type 2  Renal/GU negative Renal ROS  negative genitourinary   Musculoskeletal negative musculoskeletal ROS (+)   Abdominal   Peds negative pediatric ROS (+)  Hematology negative hematology ROS (+)   Anesthesia Other Findings   Reproductive/Obstetrics negative OB ROS                             Anesthesia Physical Anesthesia Plan  ASA: II  Anesthesia Plan: MAC   Post-op Pain Management:    Induction: Intravenous  PONV Risk Score and Plan:   Airway Management Planned: Simple Face Mask and Nasal Cannula  Additional Equipment:   Intra-op Plan:   Post-operative Plan:   Informed Consent: I have reviewed the patients History and Physical, chart, labs and discussed the procedure including the risks, benefits and alternatives for the proposed anesthesia with the patient or authorized representative who has indicated his/her understanding and acceptance.   Dental advisory given  Plan Discussed with: CRNA  Anesthesia Plan Comments:         Anesthesia Quick Evaluation

## 2018-04-26 NOTE — Transfer of Care (Signed)
Immediate Anesthesia Transfer of Care Note  Patient: Annette Washington  Procedure(s) Performed: CATARACT EXTRACTION PHACO AND INTRAOCULAR LENS PLACEMENT RIGHT EYE CDE=6.51 (Right Eye)  Patient Location: Short Stay  Anesthesia Type:MAC  Level of Consciousness: awake  Airway & Oxygen Therapy: Patient Spontanous Breathing  Post-op Assessment: Report given to RN  Post vital signs: Reviewed  Last Vitals:  Vitals Value Taken Time  BP    Temp    Pulse    Resp    SpO2      Last Pain:  Vitals:   04/26/18 0944  TempSrc: Oral  PainSc: 0-No pain      Patients Stated Pain Goal: 5 (64/40/34 7425)  Complications: No apparent anesthesia complications

## 2018-04-26 NOTE — Op Note (Signed)
Date of Admission: 04/26/2018  Date of Surgery: 04/26/2018  Pre-Op Dx: Cataract Right  Eye  Post-Op Dx: Senile Combined Cataract  Right  Eye,  Dx Code T77.116  Surgeon: Tonny Branch, M.D.  Assistants: None  Anesthesia: Topical with MAC  Indications: Painless, progressive loss of vision with compromise of daily activities.  Surgery: Cataract Extraction with Intraocular lens Implant Right Eye  Discription: The patient had dilating drops and viscous lidocaine placed into the Right eye in the pre-op holding area. After transfer to the operating room, a time out was performed. The patient was then prepped and draped. Beginning with a 29m blade a paracentesis port was made at the surgeon's 2 o'clock position. The anterior chamber was then filled with 1% non-preserved lidocaine. This was followed by filling the anterior chamber with Provisc.  A 2.440mkeratome blade was used to make a clear corneal incision at the temporal limbus.  A bent cystatome needle was used to create a continuous tear capsulotomy. Hydrodissection was performed with balanced salt solution on a Fine canula. The lens nucleus was then removed using the phacoemulsification handpiece. Residual cortex was removed with the I&A handpiece. The anterior chamber and capsular bag were refilled with Provisc. A posterior chamber intraocular lens was placed into the capsular bag with it's injector. The implant was positioned with the Kuglan hook. The Provisc was then removed from the anterior chamber and capsular bag with the I&A handpiece. Stromal hydration of the main incision and paracentesis port was performed with BSS on a Fine canula. The wounds were tested for leak which was negative. The patient tolerated the procedure well. There were no operative complications. The patient was then transferred to the recovery room in stable condition.  Complications: None  Specimen: None  EBL: None  Prosthetic device: J&J Technis, PCB00, power 23.5,  SN [id number]

## 2018-04-26 NOTE — Discharge Instructions (Signed)

## 2018-04-26 NOTE — H&P (Signed)
I have reviewed the H&P, the patient was re-examined, and I have identified no interval changes in medical condition and plan of care since the history and physical of record  

## 2018-04-27 ENCOUNTER — Encounter (HOSPITAL_COMMUNITY): Payer: Self-pay | Admitting: Ophthalmology

## 2018-05-12 DIAGNOSIS — J209 Acute bronchitis, unspecified: Secondary | ICD-10-CM | POA: Diagnosis not present

## 2018-06-10 DIAGNOSIS — J208 Acute bronchitis due to other specified organisms: Secondary | ICD-10-CM | POA: Diagnosis not present

## 2018-06-10 DIAGNOSIS — J301 Allergic rhinitis due to pollen: Secondary | ICD-10-CM | POA: Diagnosis not present

## 2018-07-21 DIAGNOSIS — Z79899 Other long term (current) drug therapy: Secondary | ICD-10-CM | POA: Diagnosis not present

## 2018-07-21 DIAGNOSIS — E782 Mixed hyperlipidemia: Secondary | ICD-10-CM | POA: Diagnosis not present

## 2018-07-21 DIAGNOSIS — R7303 Prediabetes: Secondary | ICD-10-CM | POA: Diagnosis not present

## 2018-07-21 DIAGNOSIS — I10 Essential (primary) hypertension: Secondary | ICD-10-CM | POA: Diagnosis not present

## 2018-10-21 DIAGNOSIS — E782 Mixed hyperlipidemia: Secondary | ICD-10-CM | POA: Diagnosis not present

## 2018-10-21 DIAGNOSIS — R7303 Prediabetes: Secondary | ICD-10-CM | POA: Diagnosis not present

## 2018-10-21 DIAGNOSIS — I10 Essential (primary) hypertension: Secondary | ICD-10-CM | POA: Diagnosis not present

## 2018-10-21 DIAGNOSIS — Z79899 Other long term (current) drug therapy: Secondary | ICD-10-CM | POA: Diagnosis not present

## 2018-11-01 DIAGNOSIS — I10 Essential (primary) hypertension: Secondary | ICD-10-CM | POA: Diagnosis not present

## 2018-11-01 DIAGNOSIS — R7303 Prediabetes: Secondary | ICD-10-CM | POA: Diagnosis not present

## 2018-11-08 DIAGNOSIS — R110 Nausea: Secondary | ICD-10-CM | POA: Diagnosis not present

## 2018-11-08 DIAGNOSIS — K2900 Acute gastritis without bleeding: Secondary | ICD-10-CM | POA: Diagnosis not present

## 2018-12-01 ENCOUNTER — Other Ambulatory Visit (HOSPITAL_COMMUNITY): Payer: Self-pay | Admitting: Obstetrics & Gynecology

## 2018-12-01 DIAGNOSIS — Z1231 Encounter for screening mammogram for malignant neoplasm of breast: Secondary | ICD-10-CM

## 2019-01-13 DIAGNOSIS — I10 Essential (primary) hypertension: Secondary | ICD-10-CM | POA: Diagnosis not present

## 2019-01-13 DIAGNOSIS — Z0189 Encounter for other specified special examinations: Secondary | ICD-10-CM | POA: Diagnosis not present

## 2019-01-13 DIAGNOSIS — R7303 Prediabetes: Secondary | ICD-10-CM | POA: Diagnosis not present

## 2019-01-13 DIAGNOSIS — E782 Mixed hyperlipidemia: Secondary | ICD-10-CM | POA: Diagnosis not present

## 2019-01-13 DIAGNOSIS — J309 Allergic rhinitis, unspecified: Secondary | ICD-10-CM | POA: Diagnosis not present

## 2019-01-18 DIAGNOSIS — E782 Mixed hyperlipidemia: Secondary | ICD-10-CM | POA: Diagnosis not present

## 2019-01-18 DIAGNOSIS — Z0001 Encounter for general adult medical examination with abnormal findings: Secondary | ICD-10-CM | POA: Diagnosis not present

## 2019-01-18 DIAGNOSIS — J309 Allergic rhinitis, unspecified: Secondary | ICD-10-CM | POA: Diagnosis not present

## 2019-01-18 DIAGNOSIS — I10 Essential (primary) hypertension: Secondary | ICD-10-CM | POA: Diagnosis not present

## 2019-01-21 ENCOUNTER — Other Ambulatory Visit: Payer: Self-pay

## 2019-01-21 ENCOUNTER — Ambulatory Visit (HOSPITAL_COMMUNITY)
Admission: RE | Admit: 2019-01-21 | Discharge: 2019-01-21 | Disposition: A | Discharge status: Home / Self Care | Payer: BC Managed Care – PPO | Source: Ambulatory Visit | Attending: Obstetrics & Gynecology | Admitting: Obstetrics & Gynecology

## 2019-01-21 DIAGNOSIS — Z1231 Encounter for screening mammogram for malignant neoplasm of breast: Secondary | ICD-10-CM | POA: Diagnosis not present

## 2019-01-24 ENCOUNTER — Other Ambulatory Visit: Payer: BLUE CROSS/BLUE SHIELD | Admitting: Adult Health

## 2019-04-29 DIAGNOSIS — M25552 Pain in left hip: Secondary | ICD-10-CM | POA: Diagnosis not present

## 2019-05-02 ENCOUNTER — Ambulatory Visit (HOSPITAL_COMMUNITY)
Admission: RE | Admit: 2019-05-02 | Discharge: 2019-05-02 | Disposition: A | Discharge status: Home / Self Care | Payer: BC Managed Care – PPO | Source: Ambulatory Visit | Attending: Nurse Practitioner | Admitting: Nurse Practitioner

## 2019-05-02 ENCOUNTER — Other Ambulatory Visit: Payer: Self-pay

## 2019-05-02 ENCOUNTER — Other Ambulatory Visit (HOSPITAL_COMMUNITY): Payer: Self-pay | Admitting: Nurse Practitioner

## 2019-05-02 DIAGNOSIS — M545 Low back pain, unspecified: Secondary | ICD-10-CM

## 2019-05-02 DIAGNOSIS — M546 Pain in thoracic spine: Secondary | ICD-10-CM | POA: Insufficient documentation

## 2019-05-02 DIAGNOSIS — M4184 Other forms of scoliosis, thoracic region: Secondary | ICD-10-CM | POA: Diagnosis not present

## 2019-05-02 DIAGNOSIS — M47814 Spondylosis without myelopathy or radiculopathy, thoracic region: Secondary | ICD-10-CM | POA: Diagnosis not present

## 2019-05-17 DIAGNOSIS — Z23 Encounter for immunization: Secondary | ICD-10-CM | POA: Diagnosis not present

## 2019-06-06 DIAGNOSIS — M545 Low back pain: Secondary | ICD-10-CM | POA: Diagnosis not present

## 2019-06-06 DIAGNOSIS — M4716 Other spondylosis with myelopathy, lumbar region: Secondary | ICD-10-CM | POA: Diagnosis not present

## 2019-06-27 DIAGNOSIS — R6889 Other general symptoms and signs: Secondary | ICD-10-CM | POA: Diagnosis not present

## 2019-07-21 DIAGNOSIS — E782 Mixed hyperlipidemia: Secondary | ICD-10-CM | POA: Diagnosis not present

## 2019-07-21 DIAGNOSIS — R7303 Prediabetes: Secondary | ICD-10-CM | POA: Diagnosis not present

## 2019-07-21 DIAGNOSIS — I10 Essential (primary) hypertension: Secondary | ICD-10-CM | POA: Diagnosis not present

## 2019-07-21 DIAGNOSIS — Z6837 Body mass index (BMI) 37.0-37.9, adult: Secondary | ICD-10-CM | POA: Diagnosis not present

## 2019-12-08 ENCOUNTER — Other Ambulatory Visit (HOSPITAL_COMMUNITY): Payer: Self-pay | Admitting: Emergency Medicine

## 2019-12-08 DIAGNOSIS — Z1231 Encounter for screening mammogram for malignant neoplasm of breast: Secondary | ICD-10-CM

## 2020-01-19 ENCOUNTER — Encounter (INDEPENDENT_AMBULATORY_CARE_PROVIDER_SITE_OTHER): Payer: Self-pay | Admitting: *Deleted

## 2020-01-23 ENCOUNTER — Other Ambulatory Visit: Payer: Self-pay

## 2020-01-23 ENCOUNTER — Ambulatory Visit (HOSPITAL_COMMUNITY)
Admission: RE | Admit: 2020-01-23 | Discharge: 2020-01-23 | Disposition: A | Discharge status: Home / Self Care | Payer: Medicare HMO | Source: Ambulatory Visit | Attending: Emergency Medicine | Admitting: Emergency Medicine

## 2020-01-23 DIAGNOSIS — Z1231 Encounter for screening mammogram for malignant neoplasm of breast: Secondary | ICD-10-CM | POA: Diagnosis not present

## 2020-02-14 ENCOUNTER — Other Ambulatory Visit (INDEPENDENT_AMBULATORY_CARE_PROVIDER_SITE_OTHER): Payer: Self-pay | Admitting: *Deleted

## 2020-02-14 DIAGNOSIS — Z86010 Personal history of colonic polyps: Secondary | ICD-10-CM

## 2020-03-08 ENCOUNTER — Ambulatory Visit: Payer: Medicare HMO | Admitting: Orthopaedic Surgery

## 2020-03-08 ENCOUNTER — Other Ambulatory Visit: Payer: Self-pay

## 2020-03-08 ENCOUNTER — Encounter: Payer: Self-pay | Admitting: Orthopaedic Surgery

## 2020-03-08 VITALS — BP 167/78 | HR 81 | Ht 61.0 in | Wt 201.0 lb

## 2020-03-08 DIAGNOSIS — G5601 Carpal tunnel syndrome, right upper limb: Secondary | ICD-10-CM

## 2020-03-08 NOTE — Progress Notes (Signed)
Subjective:    Patient ID: Annette Washington, female    DOB: 01/16/1956, 64 y.o.   MRN: [id number]  HPI She has pain in both wrist and hands, worse on the right hand.  She is right hand dominant.  She has numbness of the fingers long and ring and sometimes of the index and thumb.  It awakens her at night.  She has no trauma.  She has used a cock-up splint which worked for a while but now does not.  She has been seen at [facility] and I have reviewed the notes and x-rays.  I have independently reviewed and interpreted x-rays of this patient done at another site by another physician or qualified health professional.  This is not getting better.  Advil, rest, heat and ice have not helped.   Review of Systems  Constitutional: Positive for activity change.  Musculoskeletal: Positive for arthralgias.  All other systems reviewed and are negative.  For Review of Systems, all other systems reviewed and are negative.  The following is a summary of the past history medically, past history surgically, known current medicines, social history and family history.  This information is gathered electronically by the computer from prior information and documentation.  I review this each visit and have found including this information at this point in the chart is beneficial and informative.   Past Medical History:  Diagnosis Date  . Diabetes mellitus without complication ([place])   . Hypertension   . Pneumonia     Past Surgical History:  Procedure Laterality Date  . CATARACT EXTRACTION W/PHACO Left 04/12/2018   Procedure: CATARACT EXTRACTION PHACO AND INTRAOCULAR LENS PLACEMENT (IOC);  Surgeon: Tonny Branch, MD;  Location: [facility];  Service: Ophthalmology;  Laterality: Left;  CDE:  7.79  . CATARACT EXTRACTION W/PHACO Right 04/26/2018   Procedure: CATARACT EXTRACTION PHACO AND INTRAOCULAR LENS PLACEMENT RIGHT EYE CDE=6.51;  Surgeon: Tonny Branch, MD;  Location: [facility];  Service: Ophthalmology;  Laterality:  Right;  right  . COLONOSCOPY N/A 02/12/2017   Procedure: COLONOSCOPY;  Surgeon: Rogene Houston, MD;  Location: [facility];  Service: Endoscopy;  Laterality: N/A;  730  . TONSILLECTOMY      Current Outpatient Medications on File Prior to Visit  Medication Sig Dispense Refill  . amLODipine (NORVASC) 5 MG tablet Take 5 mg by mouth daily.    . cetirizine (ZYRTEC) 10 MG tablet Take 10 mg by mouth daily as needed for allergies.    . fluticasone (FLONASE) 50 MCG/ACT nasal spray Place 2 sprays into both nostrils daily as needed for allergies or rhinitis.    Marland Kitchen losartan-hydrochlorothiazide (HYZAAR) 50-12.5 MG per tablet Take 1 tablet by mouth daily.    . metFORMIN (GLUCOPHAGE) 500 MG tablet Take 500 mg by mouth at bedtime.     . pravastatin (PRAVACHOL) 10 MG tablet Take 10 mg by mouth daily.     No current facility-administered medications on file prior to visit.    Social History   Socioeconomic History  . Marital status: Married    Spouse name: Not on file  . Number of children: Not on file  . Years of education: Not on file  . Highest education level: Not on file  Occupational History  . Not on file  Tobacco Use  . Smoking status: Former Smoker    Types: Cigarettes  . Smokeless tobacco: Never Used  Substance and Sexual Activity  . Alcohol use: No  . Drug use: No  . Sexual activity:  Yes    Birth control/protection: Post-menopausal  Other Topics Concern  . Not on file  Social History Narrative  . Not on file   Social Determinants of Health   Financial Resource Strain:   . Difficulty of Paying Living Expenses: Not on file  Food Insecurity:   . Worried About Charity fundraiser in the Last Year: Not on file  . Ran Out of Food in the Last Year: Not on file  Transportation Needs:   . Lack of Transportation (Medical): Not on file  . Lack of Transportation (Non-Medical): Not on file  Physical Activity:   . Days of Exercise per Week: Not on file  . Minutes of Exercise per  Session: Not on file  Stress:   . Feeling of Stress : Not on file  Social Connections:   . Frequency of Communication with Friends and Family: Not on file  . Frequency of Social Gatherings with Friends and Family: Not on file  . Attends Religious Services: Not on file  . Active Member of Clubs or Organizations: Not on file  . Attends Archivist Meetings: Not on file  . Marital Status: Not on file  Intimate Partner Violence:   . Fear of Current or Ex-Partner: Not on file  . Emotionally Abused: Not on file  . Physically Abused: Not on file  . Sexually Abused: Not on file    Family History  Problem Relation Age of Onset  . Kidney disease Mother   . Breast cancer Maternal Grandmother     BP (!) 167/78   Pulse 81   Ht 5\' 1"  (1.549 m)   Wt 201 lb (91.2 kg)   BMI 37.98 kg/m   Body mass index is 37.98 kg/m.     Objective:   Physical Exam Vitals and nursing note reviewed.  Constitutional:      Appearance: She is well-developed.  HENT:     Head: Normocephalic and atraumatic.  Eyes:     Conjunctiva/sclera: Conjunctivae normal.     Pupils: Pupils are equal, round, and reactive to light.  Cardiovascular:     Rate and Rhythm: Normal rate and regular rhythm.  Pulmonary:     Effort: Pulmonary effort is normal.  Abdominal:     Palpations: Abdomen is soft.  Musculoskeletal:       Hands:     Cervical back: Normal range of motion and neck supple.  Skin:    General: Skin is warm and dry.  Neurological:     Mental Status: She is alert and oriented to person, place, and time.     Cranial Nerves: No cranial nerve deficit.     Motor: No abnormal muscle tone.     Coordination: Coordination normal.     Deep Tendon Reflexes: Reflexes are normal and symmetric. Reflexes normal.  Psychiatric:        Behavior: Behavior normal.        Thought Content: Thought content normal.        Judgment: Judgment normal.           Assessment & Plan:   Encounter Diagnosis  Name  Primary?  . Carpal tunnel syndrome, right upper limb Yes   I have explained what carpal tunnel is.  I have ordered EMGs.  Return in three weeks.  She may need surgery.  Call if any problem.  Precautions discussed.   Electronically Signed Sanjuana Kava, MD 9/9/202110:12 AM

## 2020-03-14 ENCOUNTER — Telehealth: Payer: Self-pay | Admitting: Orthopaedic Surgery

## 2020-03-14 NOTE — Telephone Encounter (Signed)
Patient had left a VM this morning to call her back about a referral. I have called the patient back and left a message to see what was needed.

## 2020-03-20 ENCOUNTER — Telehealth (INDEPENDENT_AMBULATORY_CARE_PROVIDER_SITE_OTHER): Payer: Self-pay | Admitting: *Deleted

## 2020-03-20 ENCOUNTER — Encounter (INDEPENDENT_AMBULATORY_CARE_PROVIDER_SITE_OTHER): Payer: Self-pay | Admitting: *Deleted

## 2020-03-20 MED ORDER — PLENVU 140 G PO SOLR
1.0000 | Freq: Once | ORAL | 0 refills | Status: AC
Start: 2020-03-20 — End: 2020-03-20

## 2020-03-20 NOTE — Telephone Encounter (Signed)
Referring MD/PCP: kikel   Procedure: tcs w mac  Reason/Indication:  Hx polyps  Has patient had this procedure before?  Yes, 2018  If so, when, by whom and where?    Is there a family history of colon cancer?  no  Who?  What age when diagnosed?    Is patient diabetic?   yes      Does patient have prosthetic heart valve or mechanical valve?  no  Do you have a pacemaker/defibrillator?  no  Has patient ever had endocarditis/atrial fibrillation? no  Does patient use oxygen? no  Has patient had joint replacement within last 12 months?  no  Is patient constipated or do they take laxatives? no  Does patient have a history of alcohol/drug use?  no  Is patient on blood thinner such as Coumadin, Plavix and/or Aspirin? no  Medications: pravastatin 10 mg daily, losartan 12.5 mg daily, metformin 500 mg daily, amlodipine 5 mg daily, baclofen 10 gm 1/2 tab tid, meloxicam 7.5 mg bid  Allergies: tylenol  Medication Adjustment per Dr Rehman/Dr Jenetta Downer hold metformin evening before procedure  Procedure date & time: 04/18/20

## 2020-03-20 NOTE — Telephone Encounter (Signed)
Patient needs Plenvu (copay card) ° °

## 2020-03-29 ENCOUNTER — Ambulatory Visit: Payer: Medicare HMO | Admitting: Orthopaedic Surgery

## 2020-04-12 NOTE — Patient Instructions (Signed)
Annette Washington  [id number]     @PREFPERIOPPHARMACY @   Your procedure is scheduled on 04/18/2020.  Report to [facility] at 6:45 A.M.  Call this number if you have problems the morning of surgery:  [phone]   Remember:  Do not eat or drink after midnight.  Please follow the prep instructions given to you by Dr. Olevia Perches office     Take these medicines the morning of surgery with A SIP OF WATER : Norvasc and Zyrtec    Do not wear jewelry, make-up or nail polish.  Do not wear lotions, powders, or perfumes, or deodorant.  Do not shave 48 hours prior to surgery.  Men may shave face and neck.  Do not bring valuables to the hospital.  [facility] is not responsible for any belongings or valuables.  Contacts, dentures or bridgework may not be worn into surgery.  Leave your suitcase in the car.  After surgery it may be brought to your room.  For patients admitted to the hospital, discharge time will be determined by your treatment team.  Patients discharged the day of surgery will not be allowed to drive home.   Name and phone number of your driver:   family Special instructions:  n/a  Please read over the following fact sheets that you were given. Care and Recovery After Surgery    Colonoscopy, Adult A colonoscopy is a procedure to look at the entire large intestine. This procedure is done using a long, thin, flexible tube that has a camera on the end. You may have a colonoscopy:  As a part of normal colorectal screening.  If you have certain symptoms, such as: ? A low number of red blood cells in your blood (anemia). ? Diarrhea that does not go away. ? Pain in your abdomen. ? Blood in your stool. A colonoscopy can help screen for and diagnose medical problems, including:  Tumors.  Extra tissue that grows where mucus forms (polyps).  Inflammation.  Areas of bleeding. Tell your health care provider about:  Any allergies you have.  All medicines you are  taking, including vitamins, herbs, eye drops, creams, and over-the-counter medicines.  Any problems you or family members have had with anesthetic medicines.  Any blood disorders you have.  Any surgeries you have had.  Any medical conditions you have.  Any problems you have had with having bowel movements.  Whether you are pregnant or may be pregnant. What are the risks? Generally, this is a safe procedure. However, problems may occur, including:  Bleeding.  Damage to your intestine.  Allergic reactions to medicines given during the procedure.  Infection. This is rare. What happens before the procedure? Eating and drinking restrictions Follow instructions from your health care provider about eating or drinking restrictions, which may include:  A few days before the procedure: ? Follow a low-fiber diet. ? Avoid nuts, seeds, dried fruit, raw fruits, and vegetables.  1-3 days before the procedure: ? Eat only gelatin dessert or ice pops. ? Drink only clear liquids, such as water, clear juice, clear broth or bouillon, black coffee or tea, or clear soft drinks or sports drinks. ? Avoid liquids that contain red or purple dye.  The day of the procedure: ? Do not eat solid foods. You may continue to drink clear liquids until up to 2 hours before the procedure. ? Do not eat or drink anything starting 2 hours before the procedure, or within the time period that your health care  provider recommends. Bowel prep If you were prescribed a bowel prep to take by mouth (orally) to clean out your colon:  Take it as told by your health care provider. Starting the day before your procedure, you will need to drink a large amount of liquid medicine. The liquid will cause you to have many bowel movements of loose stool until your stool becomes almost clear or light green.  If your skin or the opening between the buttocks (anus) gets irritated from diarrhea, you may relieve the irritation  using: ? Wipes with medicine in them, such as adult wet wipes with aloe and vitamin E. ? A product to soothe skin, such as petroleum jelly.  If you vomit while drinking the bowel prep: ? Take a break for up to 60 minutes. ? Begin the bowel prep again. ? Call your health care provider if you keep vomiting or you cannot take the bowel prep without vomiting.  To clean out your colon, you may also be given: ? Laxative medicines. These help you have a bowel movement. ? Instructions for enema use. An enema is liquid medicine injected into your rectum. Medicines Ask your health care provider about:  Changing or stopping your regular medicines or supplements. This is especially important if you are taking iron supplements, diabetes medicines, or blood thinners.  Taking medicines such as aspirin and ibuprofen. These medicines can thin your blood. Do not take these medicines unless your health care provider tells you to take them.  Taking over-the-counter medicines, vitamins, herbs, and supplements. General instructions  Ask your health care provider what steps will be taken to help prevent infection. These may include washing skin with a germ-killing soap.  Plan to have someone take you home from the hospital or clinic. What happens during the procedure?   An IV will be inserted into one of your veins.  You may be given one or more of the following: ? A medicine to help you relax (sedative). ? A medicine to numb the area (local anesthetic). ? A medicine to make you fall asleep (general anesthetic). This is rarely needed.  You will lie on your side with your knees bent.  The tube will: ? Have oil or gel put on it (be lubricated). ? Be inserted into your anus. ? Be gently eased through all parts of your large intestine.  Air will be sent into your colon to keep it open. This may cause some pressure or cramping.  Images will be taken with the camera and will appear on a screen.  A  small tissue sample may be removed to be looked at under a microscope (biopsy). The tissue may be sent to a lab for testing if any signs of problems are found.  If small polyps are found, they may be removed and checked for cancer cells.  When the procedure is finished, the tube will be removed. The procedure may vary among health care providers and hospitals. What happens after the procedure?  Your blood pressure, heart rate, breathing rate, and blood oxygen level will be monitored until you leave the hospital or clinic.  You may have a small amount of blood in your stool.  You may pass gas and have mild cramping or bloating in your abdomen. This is caused by the air that was used to open your colon during the exam.  Do not drive for 24 hours after the procedure.  It is up to you to get the results of your procedure. Ask  your health care provider, or the department that is doing the procedure, when your results will be ready. Summary  A colonoscopy is a procedure to look at the entire large intestine.  Follow instructions from your health care provider about eating and drinking before the procedure.  If you were prescribed an oral bowel prep to clean out your colon, take it as told by your health care provider.  During the colonoscopy, a flexible tube with a camera on its end is inserted into the anus and then passed into the other parts of the large intestine. This information is not intended to replace advice given to you by your health care provider. Make sure you discuss any questions you have with your health care provider. Document Revised: 01/07/2019 Document Reviewed: 01/07/2019 Elsevier Patient Education  [place].

## 2020-04-16 ENCOUNTER — Encounter (HOSPITAL_COMMUNITY)
Admission: RE | Admit: 2020-04-16 | Discharge: 2020-04-16 | Disposition: A | Discharge status: Home / Self Care | Payer: Medicare HMO | Source: Ambulatory Visit | Attending: Internal Medicine | Admitting: Internal Medicine

## 2020-04-16 ENCOUNTER — Other Ambulatory Visit: Payer: Self-pay

## 2020-04-16 ENCOUNTER — Other Ambulatory Visit (HOSPITAL_COMMUNITY)
Admission: RE | Admit: 2020-04-16 | Discharge: 2020-04-16 | Disposition: A | Discharge status: Home / Self Care | Payer: Medicare HMO | Source: Ambulatory Visit | Attending: Internal Medicine | Admitting: Internal Medicine

## 2020-04-16 DIAGNOSIS — Z01818 Encounter for other preprocedural examination: Secondary | ICD-10-CM | POA: Insufficient documentation

## 2020-04-16 DIAGNOSIS — Z20822 Contact with and (suspected) exposure to covid-19: Secondary | ICD-10-CM | POA: Diagnosis not present

## 2020-04-16 DIAGNOSIS — Z01812 Encounter for preprocedural laboratory examination: Secondary | ICD-10-CM | POA: Diagnosis present

## 2020-04-16 DIAGNOSIS — Z86010 Personal history of colonic polyps: Secondary | ICD-10-CM | POA: Diagnosis not present

## 2020-04-16 LAB — BASIC METABOLIC PANEL
Anion gap: 11 (ref 5–15)
BUN: 13 mg/dL (ref 8–23)
CO2: 27 mmol/L (ref 22–32)
Calcium: 9.7 mg/dL (ref 8.9–10.3)
Chloride: 101 mmol/L (ref 98–111)
Creatinine, Ser: 0.71 mg/dL (ref 0.44–1.00)
GFR, Estimated: >60 mL/min (ref >60)
Glucose, Bld: 124 mg/dL — ABNORMAL HIGH (ref 70–99)
Potassium: 3.7 mmol/L (ref 3.5–5.1)
Sodium: 139 mmol/L (ref 135–145)

## 2020-04-16 LAB — SARS CORONAVIRUS 2 (TAT 6-24 HRS): SARS Coronavirus 2: NEGATIVE

## 2020-04-18 ENCOUNTER — Encounter (HOSPITAL_COMMUNITY): Payer: Self-pay | Admitting: Internal Medicine

## 2020-04-18 ENCOUNTER — Ambulatory Visit (HOSPITAL_COMMUNITY)
Admission: RE | Admit: 2020-04-18 | Discharge: 2020-04-18 | Disposition: A | Discharge status: Home / Self Care | Payer: Medicare HMO | Attending: Internal Medicine | Admitting: Internal Medicine

## 2020-04-18 ENCOUNTER — Ambulatory Visit (HOSPITAL_COMMUNITY): Payer: Medicare HMO | Admitting: Anesthesiology

## 2020-04-18 ENCOUNTER — Encounter (HOSPITAL_COMMUNITY)
Admission: RE | Disposition: A | Discharge status: Home / Self Care | Payer: Self-pay | Source: Home / Self Care | Attending: Internal Medicine

## 2020-04-18 DIAGNOSIS — Z1211 Encounter for screening for malignant neoplasm of colon: Secondary | ICD-10-CM | POA: Diagnosis present

## 2020-04-18 DIAGNOSIS — Z86010 Personal history of colonic polyps: Secondary | ICD-10-CM

## 2020-04-18 DIAGNOSIS — K644 Residual hemorrhoidal skin tags: Secondary | ICD-10-CM | POA: Diagnosis not present

## 2020-04-18 DIAGNOSIS — Z8719 Personal history of other diseases of the digestive system: Secondary | ICD-10-CM | POA: Diagnosis not present

## 2020-04-18 DIAGNOSIS — Z7984 Long term (current) use of oral hypoglycemic drugs: Secondary | ICD-10-CM | POA: Diagnosis not present

## 2020-04-18 DIAGNOSIS — I10 Essential (primary) hypertension: Secondary | ICD-10-CM | POA: Diagnosis not present

## 2020-04-18 DIAGNOSIS — Z87891 Personal history of nicotine dependence: Secondary | ICD-10-CM | POA: Insufficient documentation

## 2020-04-18 DIAGNOSIS — Z9889 Other specified postprocedural states: Secondary | ICD-10-CM

## 2020-04-18 DIAGNOSIS — Z79899 Other long term (current) drug therapy: Secondary | ICD-10-CM | POA: Insufficient documentation

## 2020-04-18 DIAGNOSIS — Z09 Encounter for follow-up examination after completed treatment for conditions other than malignant neoplasm: Secondary | ICD-10-CM | POA: Diagnosis not present

## 2020-04-18 DIAGNOSIS — E119 Type 2 diabetes mellitus without complications: Secondary | ICD-10-CM | POA: Diagnosis not present

## 2020-04-18 HISTORY — PX: COLONOSCOPY WITH PROPOFOL: SHX5780

## 2020-04-18 LAB — GLUCOSE, CAPILLARY
Glucose-Capillary: 118 mg/dL — ABNORMAL HIGH (ref 70–99)
Glucose-Capillary: 102 mg/dL — ABNORMAL HIGH (ref 70–99)

## 2020-04-18 LAB — HM COLONOSCOPY

## 2020-04-18 SURGERY — COLONOSCOPY WITH PROPOFOL
Anesthesia: General

## 2020-04-18 MED ORDER — PROPOFOL 10 MG/ML IV BOLUS
INTRAVENOUS | Status: DC | PRN
  Administered 2020-04-18: 60 mg via INTRAVENOUS

## 2020-04-18 MED ORDER — LACTATED RINGERS IV SOLN: INTRAVENOUS | Status: DC | PRN

## 2020-04-18 MED ORDER — CHLORHEXIDINE GLUCONATE CLOTH 2 % EX PADS: 6.0000 | MEDICATED_PAD | Freq: Once | CUTANEOUS | Status: DC

## 2020-04-18 MED ORDER — PROPOFOL 500 MG/50ML IV EMUL
INTRAVENOUS | Status: DC | PRN
  Administered 2020-04-18: 150 ug/kg/min via INTRAVENOUS

## 2020-04-18 NOTE — Discharge Instructions (Signed)
Do not take meloxicam or Naprosyn together.  Take 1 or the other on a given day. Resume other medications as before. Resume usual diet. No driving for 24 hours. Next colonoscopy in 5 years.   Colonoscopy, Adult, Care After This sheet gives you information about how to care for yourself after your procedure. Your health care provider may also give you more specific instructions. If you have problems or questions, contact your health care provider. What can I expect after the procedure? After the procedure, it is common to have:  A small amount of blood in your stool for 24 hours after the procedure.  Some gas.  Mild cramping or bloating of your abdomen. Follow these instructions at home: Eating and drinking   Drink enough fluid to keep your urine pale yellow.  Follow instructions from your health care provider about eating or drinking restrictions.  Resume your normal diet as instructed by your health care provider. Avoid heavy or fried foods that are hard to digest. Activity  Rest as told by your health care provider.  Avoid sitting for a long time without moving. Get up to take short walks every 1-2 hours. This is important to improve blood flow and breathing. Ask for help if you feel weak or unsteady.  Return to your normal activities as told by your health care provider. Ask your health care provider what activities are safe for you. Managing cramping and bloating   Try walking around when you have cramps or feel bloated.  Apply heat to your abdomen as told by your health care provider. Use the heat source that your health care provider recommends, such as a moist heat pack or a heating pad. ? Place a towel between your skin and the heat source. ? Leave the heat on for 20-30 minutes. ? Remove the heat if your skin turns bright red. This is especially important if you are unable to feel pain, heat, or cold. You may have a greater risk of getting burned. General  instructions  For the first 24 hours after the procedure: ? Do not drive or use machinery. ? Do not sign important documents. ? Do not drink alcohol. ? Do your regular daily activities at a slower pace than normal. ? Eat soft foods that are easy to digest.  Take over-the-counter and prescription medicines only as told by your health care provider.  Keep all follow-up visits as told by your health care provider. This is important. Contact a health care provider if:  You have blood in your stool 2-3 days after the procedure. Get help right away if you have:  More than a small spotting of blood in your stool.  Large blood clots in your stool.  Swelling of your abdomen.  Nausea or vomiting.  A fever.  Increasing pain in your abdomen that is not relieved with medicine. Summary  After the procedure, it is common to have a small amount of blood in your stool. You may also have mild cramping and bloating of your abdomen.  For the first 24 hours after the procedure, do not drive or use machinery, sign important documents, or drink alcohol.  Get help right away if you have a lot of blood in your stool, nausea or vomiting, a fever, or increased pain in your abdomen. This information is not intended to replace advice given to you by your health care provider. Make sure you discuss any questions you have with your health care provider. Document Revised: 01/10/2019 Document Reviewed:  01/10/2019 Elsevier Patient Education  2020 [place] POST-ANESTHESIA  IMMEDIATELY FOLLOWING SURGERY:  Do not drive or operate machinery for the first twenty four hours after surgery.  Do not make any important decisions for twenty four hours after surgery or while taking narcotic pain medications or sedatives.  If you develop intractable nausea and vomiting or a severe headache please notify your doctor immediately.  FOLLOW-UP:  Please make an appointment with your surgeon as  instructed. You do not need to follow up with anesthesia unless specifically instructed to do so.  WOUND CARE INSTRUCTIONS (if applicable):  Keep a dry clean dressing on the anesthesia/puncture wound site if there is drainage.  Once the wound has quit draining you may leave it open to air.  Generally you should leave the bandage intact for twenty four hours unless there is drainage.  If the epidural site drains for more than 36-48 hours please call the anesthesia department.  QUESTIONS?:  Please feel free to call your physician or the hospital operator if you have any questions, and they will be happy to assist you.

## 2020-04-18 NOTE — Anesthesia Preprocedure Evaluation (Signed)
Anesthesia Evaluation  Patient identified by MRN, date of birth, ID band Patient awake    Reviewed: Allergy & Precautions, H&P , NPO status , Patient's Chart, lab work & pertinent test results, reviewed documented beta blocker date and time   Airway Mallampati: II  TM Distance: >3 FB Neck ROM: full    Dental no notable dental hx.    Pulmonary pneumonia, resolved, former smoker,    Pulmonary exam normal breath sounds clear to auscultation       Cardiovascular Exercise Tolerance: Good hypertension, negative cardio ROS   Rhythm:regular Rate:Normal     Neuro/Psych negative neurological ROS  negative psych ROS   GI/Hepatic negative GI ROS, Neg liver ROS,   Endo/Other  negative endocrine ROSdiabetes, Type 2  Renal/GU negative Renal ROS  negative genitourinary   Musculoskeletal   Abdominal   Peds  Hematology negative hematology ROS (+)   Anesthesia Other Findings   Reproductive/Obstetrics negative OB ROS                             Anesthesia Physical Anesthesia Plan  ASA: II  Anesthesia Plan: General   Post-op Pain Management:    Induction:   PONV Risk Score and Plan:   Airway Management Planned:   Additional Equipment:   Intra-op Plan:   Post-operative Plan:   Informed Consent: I have reviewed the patients History and Physical, chart, labs and discussed the procedure including the risks, benefits and alternatives for the proposed anesthesia with the patient or authorized representative who has indicated his/her understanding and acceptance.     Dental Advisory Given  Plan Discussed with: CRNA  Anesthesia Plan Comments:         Anesthesia Quick Evaluation

## 2020-04-18 NOTE — Anesthesia Postprocedure Evaluation (Signed)
Anesthesia Post Note  Patient: Annette Washington  Procedure(s) Performed: COLONOSCOPY WITH PROPOFOL (N/A )  Patient location during evaluation: PACU Anesthesia Type: General Level of consciousness: awake, oriented, awake and alert and patient cooperative Pain management: pain level controlled Vital Signs Assessment: post-procedure vital signs reviewed and stable Respiratory status: spontaneous breathing, respiratory function stable and nonlabored ventilation Cardiovascular status: stable Postop Assessment: no apparent nausea or vomiting Anesthetic complications: no   No complications documented.   Last Vitals:  Vitals:   04/18/20 0724  BP: (!) 151/66  Pulse: 76  Resp: 18  Temp: 36.9 C  SpO2: 99%    Last Pain:  Vitals:   04/18/20 0812  TempSrc:   PainSc: 0-No pain                 Kryssa Risenhoover

## 2020-04-18 NOTE — Op Note (Signed)
[facility] Patient Name: Annette Washington Procedure Date: 04/18/2020 8:06 AM MRN: [id number] Date of Birth: Sep 08, 1955 Attending MD: Hildred Laser , MD CSN: [id number] Age: 64 Admit Type: Outpatient Procedure:                Colonoscopy Indications:              High risk colon cancer surveillance: Personal                            history of colonic polyps Providers:                Hildred Laser, MD, Otis Peak B. Sharon Seller, RN, Raphael Gibney, Technician Referring MD:             Vidal Schwalbe, MD Medicines:                Propofol per Anesthesia Complications:            No immediate complications. Estimated Blood Loss:     Estimated blood loss: none. Procedure:                Pre-Anesthesia Assessment:                           - Prior to the procedure, a History and Physical                            was performed, and patient medications and                            allergies were reviewed. The patient's tolerance of                            previous anesthesia was also reviewed. The risks                            and benefits of the procedure and the sedation                            options and risks were discussed with the patient.                            All questions were answered, and informed consent                            was obtained. Prior Anticoagulants: The patient has                            taken no previous anticoagulant or antiplatelet                            agents except for NSAID medication. ASA Grade  Assessment: II - A patient with mild systemic                            disease. After reviewing the risks and benefits,                            the patient was deemed in satisfactory condition to                            undergo the procedure.                           After obtaining informed consent, the colonoscope                            was passed under direct vision.  Throughout the                            procedure, the patient's blood pressure, pulse, and                            oxygen saturations were monitored continuously. The                            [id number]) was introduced through the anus                            and advanced to the the cecum, identified by                            appendiceal orifice and ileocecal valve. The                            colonoscopy was performed without difficulty. The                            patient tolerated the procedure well. The quality                            of the bowel preparation was excellent. The                            ileocecal valve, appendiceal orifice, and rectum                            were photographed. Scope In: 8:17:31 AM Scope Out: 8:26:25 AM Scope Withdrawal Time: 0 hours 6 minutes 4 seconds  Total Procedure Duration: 0 hours 8 minutes 54 seconds  Findings:      The perianal and digital rectal examinations were normal.      A post polypectomy scar was found in the transverse colon. The scar       tissue was healthy in appearance.      The exam was otherwise normal throughout the examined colon.      External hemorrhoids were found during retroflexion.  The hemorrhoids       were small. Impression:               - Post-polypectomy scar in the transverse colon                            with tattoo proximally and distally.                           - External hemorrhoids.                           - No specimens collected.                           Comments: No evidence of recurrent polyps. Moderate Sedation:      Per Anesthesia Care Recommendation:           - Patient has a contact number available for                            emergencies. The signs and symptoms of potential                            delayed complications were discussed with the                            patient. Return to normal activities tomorrow.                             Written discharge instructions were provided to the                            patient.                           - Resume previous diet today.                           - Continue present medications.                           - Repeat colonoscopy in 5 years for surveillance. Procedure Code(s):        --- Professional ---                           [phone], Colonoscopy, flexible; diagnostic, including                            collection of specimen(s) by brushing or washing,                            when performed (separate procedure) Diagnosis Code(s):        --- Professional ---                           Z86.010, Personal history of colonic polyps  Z98.890, Other specified postprocedural states                           K64.4, Residual hemorrhoidal skin tags CPT copyright 2019 American Medical Association. All rights reserved. The codes documented in this report are preliminary and upon coder review may  be revised to meet current compliance requirements. Hildred Laser, MD Hildred Laser, MD 04/18/2020 8:37:26 AM This report has been signed electronically. Number of Addenda: 0

## 2020-04-18 NOTE — Transfer of Care (Signed)
Immediate Anesthesia Transfer of Care Note  Patient: Annette Washington  Procedure(s) Performed: COLONOSCOPY WITH PROPOFOL (N/A )  Patient Location: PACU  Anesthesia Type:General  Level of Consciousness: awake, alert , oriented and patient cooperative  Airway & Oxygen Therapy: Patient Spontanous Breathing  Post-op Assessment: Report given to RN and Post -op Vital signs reviewed and stable  Post vital signs: Reviewed and stable  Last Vitals:  Vitals Value Taken Time  BP    Temp    Pulse 82 04/18/20 0832  Resp 18 04/18/20 0832  SpO2 96 % 04/18/20 0832  Vitals shown include unvalidated device data.  Last Pain:  Vitals:   04/18/20 0812  TempSrc:   PainSc: 0-No pain      Patients Stated Pain Goal: 5 (67/61/95 0932)  Complications: No complications documented.

## 2020-04-18 NOTE — H&P (Signed)
Annette Washington is an 64 y.o. female.   Chief Complaint: Patient is here for colonoscopy. HPI: Patient is 64 year old African-American female who underwent colonoscopy in August 2018 with removal of 12 mm tubulovillous adenoma from transverse colon.  She is here for surveillance colonoscopy.  She denies abdominal pain change in bowel habits or rectal bleeding. Patient is on meloxicam on as-needed basis but does not take aspirin or coagulants. Family history is negative for CRC.  Past Medical History:  Diagnosis Date  . Diabetes mellitus without complication ([place])   . Hypertension   . Pneumonia        History of colonic tubulovillous adenoma.  Past Surgical History:  Procedure Laterality Date  . CATARACT EXTRACTION W/PHACO Left 04/12/2018   Procedure: CATARACT EXTRACTION PHACO AND INTRAOCULAR LENS PLACEMENT (IOC);  Surgeon: Tonny Branch, MD;  Location: [facility];  Service: Ophthalmology;  Laterality: Left;  CDE:  7.79  . CATARACT EXTRACTION W/PHACO Right 04/26/2018   Procedure: CATARACT EXTRACTION PHACO AND INTRAOCULAR LENS PLACEMENT RIGHT EYE CDE=6.51;  Surgeon: Tonny Branch, MD;  Location: [facility];  Service: Ophthalmology;  Laterality: Right;  right  . COLONOSCOPY N/A 02/12/2017   Procedure: COLONOSCOPY;  Surgeon: Rogene Houston, MD;  Location: [facility];  Service: Endoscopy;  Laterality: N/A;  730  . TONSILLECTOMY      Family History  Problem Relation Age of Onset  . Kidney disease Mother   . Breast cancer Maternal Grandmother    Social History:  reports that she has quit smoking. Her smoking use included cigarettes. She has never used smokeless tobacco. She reports that she does not drink alcohol and does not use drugs.  Allergies:  Allergies  Allergen Reactions  . Tylenol [Acetaminophen] Rash    Medications Prior to Admission  Medication Sig Dispense Refill  . amLODipine (NORVASC) 5 MG tablet Take 5 mg by mouth daily.    Marland Kitchen ascorbic acid (VITAMIN C) 500 MG tablet Take 500  mg by mouth daily.    . baclofen (LIORESAL) 10 MG tablet Take 10 mg by mouth daily as needed for muscle spasms.     . cetirizine (ZYRTEC) 10 MG tablet Take 10 mg by mouth daily as needed for allergies.    . cholecalciferol (VITAMIN D3) 25 MCG (1000 UNIT) tablet Take 1,000 Units by mouth daily.    Marland Kitchen ELDERBERRY PO Take 1 capsule by mouth daily.    . fluticasone (FLONASE) 50 MCG/ACT nasal spray Place 2 sprays into both nostrils daily as needed for allergies or rhinitis.    . meloxicam (MOBIC) 7.5 MG tablet Take 7.5 mg by mouth daily as needed for pain.    . metFORMIN (GLUCOPHAGE) 500 MG tablet Take 500 mg by mouth at bedtime.     . pravastatin (PRAVACHOL) 10 MG tablet Take 10 mg by mouth daily.    . naproxen sodium (ALEVE) 220 MG tablet Take 440 mg by mouth daily as needed (pain).      Results for orders placed or performed during the hospital encounter of 04/18/20 (from the past 48 hour(s))  Glucose, capillary     Status: Abnormal   Collection Time: 04/18/20  7:08 AM  Result Value Ref Range   Glucose-Capillary 118 (H) 70 - 99 mg/dL    Comment: Glucose reference range applies only to samples taken after fasting for at least 8 hours.   No results found.  Review of Systems  Blood pressure (!) 151/66, pulse 76, temperature 98.5 F (36.9 C), temperature source Oral,  resp. rate 18, SpO2 99 %. Physical Exam HENT:     Mouth/Throat:     Mouth: Mucous membranes are moist.     Pharynx: Oropharynx is clear.  Eyes:     General: No scleral icterus.    Conjunctiva/sclera: Conjunctivae normal.  Cardiovascular:     Rate and Rhythm: Normal rate.     Heart sounds: Normal heart sounds. No murmur heard.   Pulmonary:     Effort: Pulmonary effort is normal.     Breath sounds: Normal breath sounds.  Abdominal:     General: There is no distension.     Palpations: Abdomen is soft. There is no mass.  Musculoskeletal:        General: No swelling.     Cervical back: Neck supple.  Lymphadenopathy:      Cervical: No cervical adenopathy.  Skin:    General: Skin is warm and dry.  Neurological:     Mental Status: She is alert.      Assessment/Plan  History of tubulovillous adenoma. Surveillance colonoscopy.  Hildred Laser, MD 04/18/2020, 8:07 AM

## 2020-04-20 ENCOUNTER — Encounter (INDEPENDENT_AMBULATORY_CARE_PROVIDER_SITE_OTHER): Payer: Self-pay | Admitting: *Deleted

## 2020-04-23 ENCOUNTER — Encounter (HOSPITAL_COMMUNITY): Payer: Self-pay | Admitting: Internal Medicine

## 2020-04-24 ENCOUNTER — Telehealth: Payer: Self-pay

## 2020-04-24 NOTE — Telephone Encounter (Signed)
Patient called in wanting to know if she can return to work after being seen on 10/29

## 2020-04-24 NOTE — Telephone Encounter (Signed)
Called patient to advise that she can return to work following her NCV/ EMG on 10/29.

## 2020-04-27 ENCOUNTER — Other Ambulatory Visit: Payer: Self-pay

## 2020-04-27 ENCOUNTER — Encounter: Payer: Self-pay | Admitting: Physical Medicine and Rehabilitation

## 2020-04-27 ENCOUNTER — Ambulatory Visit (INDEPENDENT_AMBULATORY_CARE_PROVIDER_SITE_OTHER): Payer: Medicare HMO | Admitting: Physical Medicine and Rehabilitation

## 2020-04-27 DIAGNOSIS — R202 Paresthesia of skin: Secondary | ICD-10-CM

## 2020-04-27 NOTE — Progress Notes (Signed)
   Numeric Pain Rating Scale and Functional Assessment Average Pain 2   In the last MONTH (on 0-10 scale) has pain interfered with the following?  1. General activity like being  able to carry out your everyday physical activities such as walking, climbing stairs, carrying groceries, or moving a chair?  Rating(8)     

## 2020-04-30 NOTE — Procedures (Signed)
EMG & NCV Findings: Evaluation of the right median motor nerve showed prolonged distal onset latency (6.3 ms).  The right median (across palm) sensory nerve showed prolonged distal peak latency (Wrist, 6.8 ms), reduced amplitude (9.0 V), and prolonged distal peak latency (Palm, 3.9 ms).  All remaining nerves (as indicated in the following tables) were within normal limits.    All examined muscles (as indicated in the following table) showed no evidence of electrical instability.    Impression: The above electrodiagnostic study is ABNORMAL and reveals evidence of a moderate to severe right median nerve entrapment at the wrist (carpal tunnel syndrome) affecting sensory and motor components.   There is no significant electrodiagnostic evidence of any other focal nerve entrapment, brachial plexopathy or cervical radiculopathy.   Recommendations: 1.  Follow-up with referring physician. 2.  Continue current management of symptoms. 3.  Continue use of resting splint at night-time and as needed during the day. 4.  Suggest surgical evaluation.  ___________________________ Laurence Spates FAAPMR Board Certified, American Board of Physical Medicine and Rehabilitation    Nerve Conduction Studies Anti Sensory Summary Table   Stim Site NR Peak (ms) Norm Peak (ms) P-T Amp (V) Norm P-T Amp Site1 Site2 Delta-P (ms) Dist (cm) Vel (m/s) Norm Vel (m/s)  Right Median Acr Palm Anti Sensory (2nd Digit)  30.1C  Wrist    *6.8 <3.6 *9.0 >10 Wrist Palm 2.9 0.0    Palm    *3.9 <2.0 3.2         Right Radial Anti Sensory (Base 1st Digit)  29.9C  Wrist    2.3 <3.1 10.6  Wrist Base 1st Digit 2.3 0.0    Right Ulnar Anti Sensory (5th Digit)  30.1C  Wrist    3.6 <3.7 27.4 >15.0 Wrist 5th Digit 3.6 14.0 39 >38   Motor Summary Table   Stim Site NR Onset (ms) Norm Onset (ms) O-P Amp (mV) Norm O-P Amp Site1 Site2 Delta-0 (ms) Dist (cm) Vel (m/s) Norm Vel (m/s)  Right Median Motor (Abd Poll Brev)  29.9C  Wrist     *6.3 <4.2 5.6 >5 Elbow Wrist 3.0 18.0 60 >50  Elbow    9.3  6.9         Right Ulnar Motor (Abd Dig Min)  30C  Wrist    3.0 <4.2 8.2 >3 B Elbow Wrist 2.9 17.0 59 >53  B Elbow    5.9  7.6  A Elbow B Elbow 1.1 10.0 91 >53  A Elbow    7.0  7.4          EMG   Side Muscle Nerve Root Ins Act Fibs Psw Amp Dur Poly Recrt Int Fraser Din Comment  Right Abd Poll Brev Median C8-T1 Nml Nml Nml Nml Nml 0 Nml Nml   Right 1stDorInt Ulnar C8-T1 Nml Nml Nml Nml Nml 0 Nml Nml   Right PronatorTeres Median C6-7 Nml Nml Nml Nml Nml 0 Nml Nml   Right Biceps Musculocut C5-6 Nml Nml Nml Nml Nml 0 Nml Nml   Right Deltoid Axillary C5-6 Nml Nml Nml Nml Nml 0 Nml Nml     Nerve Conduction Studies Anti Sensory Left/Right Comparison   Stim Site L Lat (ms) R Lat (ms) L-R Lat (ms) L Amp (V) R Amp (V) L-R Amp (%) Site1 Site2 L Vel (m/s) R Vel (m/s) L-R Vel (m/s)  Median Acr Palm Anti Sensory (2nd Digit)  30.1C  Wrist  *6.8   *9.0  Wrist Palm  Palm  *3.9   3.2        Radial Anti Sensory (Base 1st Digit)  29.9C  Wrist  2.3   10.6  Wrist Base 1st Digit     Ulnar Anti Sensory (5th Digit)  30.1C  Wrist  3.6   27.4  Wrist 5th Digit  39    Motor Left/Right Comparison   Stim Site L Lat (ms) R Lat (ms) L-R Lat (ms) L Amp (mV) R Amp (mV) L-R Amp (%) Site1 Site2 L Vel (m/s) R Vel (m/s) L-R Vel (m/s)  Median Motor (Abd Poll Brev)  29.9C  Wrist  *6.3   5.6  Elbow Wrist  60   Elbow  9.3   6.9        Ulnar Motor (Abd Dig Min)  30C  Wrist  3.0   8.2  B Elbow Wrist  59   B Elbow  5.9   7.6  A Elbow B Elbow  91   A Elbow  7.0   7.4           Waveforms:

## 2020-05-01 ENCOUNTER — Other Ambulatory Visit: Payer: Self-pay

## 2020-05-01 ENCOUNTER — Ambulatory Visit: Payer: Medicare HMO | Admitting: Orthopaedic Surgery

## 2020-05-01 ENCOUNTER — Encounter: Payer: Self-pay | Admitting: Orthopaedic Surgery

## 2020-05-01 VITALS — BP 181/92 | HR 84 | Ht 61.0 in | Wt 203.0 lb

## 2020-05-01 DIAGNOSIS — G5601 Carpal tunnel syndrome, right upper limb: Secondary | ICD-10-CM

## 2020-05-01 NOTE — Progress Notes (Signed)
Patient Annette Washington, female DOB:Oct 28, 1955, 64 y.o. OZD:[id number]  Chief Complaint  Patient presents with  . Wrist Pain    HPI  Annette Washington is a 64 y.o. female who has carpal tunnel symptoms of the right hand.  She had EMGs done which shows moderate to severe carpal tunnel on the right.  I have explained the results of the EMG and I have explained the elective procedure to her.  She is agreeable to surgery.   Body mass index is 38.36 kg/m.  ROS  Review of Systems  Constitutional: Positive for activity change.  Musculoskeletal: Positive for arthralgias.  All other systems reviewed and are negative.   All other systems reviewed and are negative.  The following is a summary of the past history medically, past history surgically, known current medicines, social history and family history.  This information is gathered electronically by the computer from prior information and documentation.  I review this each visit and have found including this information at this point in the chart is beneficial and informative.    Past Medical History:  Diagnosis Date  . Diabetes mellitus without complication ([place])   . Hypertension   . Pneumonia     Past Surgical History:  Procedure Laterality Date  . CATARACT EXTRACTION W/PHACO Left 04/12/2018   Procedure: CATARACT EXTRACTION PHACO AND INTRAOCULAR LENS PLACEMENT (IOC);  Surgeon: Tonny Branch, MD;  Location: [facility];  Service: Ophthalmology;  Laterality: Left;  CDE:  7.79  . CATARACT EXTRACTION W/PHACO Right 04/26/2018   Procedure: CATARACT EXTRACTION PHACO AND INTRAOCULAR LENS PLACEMENT RIGHT EYE CDE=6.51;  Surgeon: Tonny Branch, MD;  Location: [facility];  Service: Ophthalmology;  Laterality: Right;  right  . COLONOSCOPY N/A 02/12/2017   Procedure: COLONOSCOPY;  Surgeon: Rogene Houston, MD;  [facility];  Service: Endoscopy;  Laterality: N/A;  730  . COLONOSCOPY WITH PROPOFOL N/A 04/18/2020   Procedure: COLONOSCOPY  WITH PROPOFOL;  Surgeon: Rogene Houston, MD;  Location: [facility];  Service: Endoscopy;  Laterality: N/A;  830  . TONSILLECTOMY      Family History  Problem Relation Age of Onset  . Kidney disease Mother   . Breast cancer Maternal Grandmother     Social History Social History   Tobacco Use  . Smoking status: Former Smoker    Types: Cigarettes  . Smokeless tobacco: Never Used  Substance Use Topics  . Alcohol use: No  . Drug use: No    Allergies  Allergen Reactions  . Tylenol [Acetaminophen] Rash    Current Outpatient Medications  Medication Sig Dispense Refill  . amLODipine (NORVASC) 5 MG tablet Take 5 mg by mouth daily.    Marland Kitchen ascorbic acid (VITAMIN C) 500 MG tablet Take 500 mg by mouth daily.    . baclofen (LIORESAL) 10 MG tablet Take 10 mg by mouth daily as needed for muscle spasms.     . cetirizine (ZYRTEC) 10 MG tablet Take 10 mg by mouth daily as needed for allergies.    . cholecalciferol (VITAMIN D3) 25 MCG (1000 UNIT) tablet Take 1,000 Units by mouth daily.    Marland Kitchen ELDERBERRY PO Take 1 capsule by mouth daily.    . fluticasone (FLONASE) 50 MCG/ACT nasal spray Place 2 sprays into both nostrils daily as needed for allergies or rhinitis.    . meloxicam (MOBIC) 7.5 MG tablet Take 7.5 mg by mouth daily as needed for pain.    . metFORMIN (GLUCOPHAGE) 500 MG tablet Take 500 mg by  mouth at bedtime.     . naproxen sodium (ALEVE) 220 MG tablet Take 440 mg by mouth daily as needed (pain).    . pravastatin (PRAVACHOL) 10 MG tablet Take 10 mg by mouth daily.     No current facility-administered medications for this visit.     Physical Exam  Blood pressure (!) 181/92, pulse 84, height 5\' 1"  (1.549 m), weight 203 lb (92.1 kg).  Constitutional: overall normal hygiene, normal nutrition, well developed, normal grooming, normal body habitus. Assistive device:none  Musculoskeletal: gait and station Limp none, muscle tone and strength are normal, no tremors or atrophy is  present.  .  Neurological: coordination overall normal.  Deep tendon reflex/nerve stretch intact.  Sensation normal.  Cranial nerves II-XII intact.   Skin:   Normal overall no scars, lesions, ulcers or rashes. No psoriasis.  Psychiatric: Alert and oriented x 3.  Recent memory intact, remote memory unclear.  Normal mood and affect. Well groomed.  Good eye contact.  Cardiovascular: overall no swelling, no varicosities, no edema bilaterally, normal temperatures of the legs and arms, no clubbing, cyanosis and good capillary refill.  Lymphatic: palpation is normal.  All other systems reviewed and are negative   The patient has been educated about the nature of the problem(s) and counseled on treatment options.  The patient appeared to understand what I have discussed and is in agreement with it.  Encounter Diagnosis  Name Primary?  . Carpal tunnel syndrome, right upper limb Yes    PLAN Call if any problems.  Precautions discussed.  Continue current medications.   Return to clinic for surgery of the right wrist.  To see Dr. Aline Brochure.   Electronically Signed Sanjuana Kava, MD 11/2/20219:30 AM

## 2020-05-02 NOTE — Progress Notes (Signed)
Annette Washington - 64 y.o. female MRN [id number]  Date of birth: 12-19-1955  Office Visit Note: Visit Date: 04/27/2020 PCP: Vidal Schwalbe, MD Referred by: Vidal Schwalbe, MD  Subjective: Chief Complaint  Patient presents with   Right Hand - Pain   HPI:  Annette Washington is a 64 y.o. female who comes in today at the request of Dr. Sanjuana Kava for electrodiagnostic study of the Right upper extremities.  Patient is Right hand dominant. She reports right wrist pain that travels to her right hand and fingers with numbness in the radial digits. She reports positive flick sign and feels like the hand is swollen at times.   ROS Otherwise per HPI.  Assessment & Plan: Visit Diagnoses:  1. Paresthesia of skin     Plan: Impression: The above electrodiagnostic study is ABNORMAL and reveals evidence of a moderate to severe right median nerve entrapment at the wrist (carpal tunnel syndrome) affecting sensory and motor components.   There is no significant electrodiagnostic evidence of any other focal nerve entrapment, brachial plexopathy or cervical radiculopathy.   Recommendations: 1.  Follow-up with referring physician. 2.  Continue current management of symptoms. 3.  Continue use of resting splint at night-time and as needed during the day. 4.  Suggest surgical evaluation.   Meds & Orders: No orders of the defined types were placed in this encounter.   Orders Placed This Encounter  Procedures   NCV with EMG (electromyography)    Follow-up: No follow-ups on file.   Procedures: No procedures performed  EMG & NCV Findings: Evaluation of the right median motor nerve showed prolonged distal onset latency (6.3 ms).  The right median (across palm) sensory nerve showed prolonged distal peak latency (Wrist, 6.8 ms), reduced amplitude (9.0 V), and prolonged distal peak latency (Palm, 3.9 ms).  All remaining nerves (as indicated in the following tables) were within normal limits.     All examined muscles (as indicated in the following table) showed no evidence of electrical instability.    Impression: The above electrodiagnostic study is ABNORMAL and reveals evidence of a moderate to severe right median nerve entrapment at the wrist (carpal tunnel syndrome) affecting sensory and motor components.   There is no significant electrodiagnostic evidence of any other focal nerve entrapment, brachial plexopathy or cervical radiculopathy.   Recommendations: 1.  Follow-up with referring physician. 2.  Continue current management of symptoms. 3.  Continue use of resting splint at night-time and as needed during the day. 4.  Suggest surgical evaluation.  ___________________________ Laurence Spates FAAPMR Board Certified, American Board of Physical Medicine and Rehabilitation    Nerve Conduction Studies Anti Sensory Summary Table   Stim Site NR Peak (ms) Norm Peak (ms) P-T Amp (V) Norm P-T Amp Site1 Site2 Delta-P (ms) Dist (cm) Vel (m/s) Norm Vel (m/s)  Right Median Acr Palm Anti Sensory (2nd Digit)  30.1C  Wrist    *6.8 <3.6 *9.0 >10 Wrist Palm 2.9 0.0    Palm    *3.9 <2.0 3.2         Right Radial Anti Sensory (Base 1st Digit)  29.9C  Wrist    2.3 <3.1 10.6  Wrist Base 1st Digit 2.3 0.0    Right Ulnar Anti Sensory (5th Digit)  30.1C  Wrist    3.6 <3.7 27.4 >15.0 Wrist 5th Digit 3.6 14.0 39 >38   Motor Summary Table   Stim Site NR Onset (ms) Norm Onset (ms) O-P Amp (mV) Norm O-P Amp Site1  Site2 Delta-0 (ms) Dist (cm) Vel (m/s) Norm Vel (m/s)  Right Median Motor (Abd Poll Brev)  29.9C  Wrist    *6.3 <4.2 5.6 >5 Elbow Wrist 3.0 18.0 60 >50  Elbow    9.3  6.9         Right Ulnar Motor (Abd Dig Min)  30C  Wrist    3.0 <4.2 8.2 >3 B Elbow Wrist 2.9 17.0 59 >53  B Elbow    5.9  7.6  A Elbow B Elbow 1.1 10.0 91 >53  A Elbow    7.0  7.4          EMG   Side Muscle Nerve Root Ins Act Fibs Psw Amp Dur Poly Recrt Int Fraser Din Comment  Right Abd Poll Brev Median C8-T1 Nml  Nml Nml Nml Nml 0 Nml Nml   Right 1stDorInt Ulnar C8-T1 Nml Nml Nml Nml Nml 0 Nml Nml   Right PronatorTeres Median C6-7 Nml Nml Nml Nml Nml 0 Nml Nml   Right Biceps Musculocut C5-6 Nml Nml Nml Nml Nml 0 Nml Nml   Right Deltoid Axillary C5-6 Nml Nml Nml Nml Nml 0 Nml Nml     Nerve Conduction Studies Anti Sensory Left/Right Comparison   Stim Site L Lat (ms) R Lat (ms) L-R Lat (ms) L Amp (V) R Amp (V) L-R Amp (%) Site1 Site2 L Vel (m/s) R Vel (m/s) L-R Vel (m/s)  Median Acr Palm Anti Sensory (2nd Digit)  30.1C  Wrist  *6.8   *9.0  Wrist Palm     Palm  *3.9   3.2        Radial Anti Sensory (Base 1st Digit)  29.9C  Wrist  2.3   10.6  Wrist Base 1st Digit     Ulnar Anti Sensory (5th Digit)  30.1C  Wrist  3.6   27.4  Wrist 5th Digit  39    Motor Left/Right Comparison   Stim Site L Lat (ms) R Lat (ms) L-R Lat (ms) L Amp (mV) R Amp (mV) L-R Amp (%) Site1 Site2 L Vel (m/s) R Vel (m/s) L-R Vel (m/s)  Median Motor (Abd Poll Brev)  29.9C  Wrist  *6.3   5.6  Elbow Wrist  60   Elbow  9.3   6.9        Ulnar Motor (Abd Dig Min)  30C  Wrist  3.0   8.2  B Elbow Wrist  59   B Elbow  5.9   7.6  A Elbow B Elbow  91   A Elbow  7.0   7.4           Waveforms:             Clinical History: No specialty comments available.     Objective:  VS:  HT:     WT:    BMI:      BP:    HR: bpm   TEMP: ( )   RESP:  Physical Exam Musculoskeletal:        General: No swelling, tenderness or deformity.     Comments: Inspection reveals no atrophy of the bilateral APB or FDI or hand intrinsics. There is no swelling, color changes, allodynia or dystrophic changes. There is 5 out of 5 strength in the bilateral wrist extension, finger abduction and long finger flexion. There is intact sensation to light touch in all dermatomal and peripheral nerve distributions.  There is a positive Phalen's test on the right. There is a  negative Hoffmann's test bilaterally.  Skin:    General: Skin is warm and dry.      Findings: No erythema or rash.  Neurological:     General: No focal deficit present.     Mental Status: She is alert and oriented to person, place, and time.     Motor: No weakness or abnormal muscle tone.     Coordination: Coordination normal.  Psychiatric:        Mood and Affect: Mood normal.        Behavior: Behavior normal.      Imaging: No results found.

## 2020-05-11 ENCOUNTER — Other Ambulatory Visit: Payer: Self-pay

## 2020-05-11 ENCOUNTER — Ambulatory Visit: Payer: Medicare HMO | Admitting: Orthopedic Surgery

## 2020-05-11 ENCOUNTER — Encounter: Payer: Self-pay | Admitting: Orthopedic Surgery

## 2020-05-11 VITALS — BP 150/78 | HR 75 | Ht 61.0 in | Wt 203.0 lb

## 2020-05-11 DIAGNOSIS — G5601 Carpal tunnel syndrome, right upper limb: Secondary | ICD-10-CM | POA: Diagnosis not present

## 2020-05-11 NOTE — Patient Instructions (Signed)
Plan to proceed with right carpal tunnel release, to be scheduled for June 18, 2020

## 2020-05-11 NOTE — Progress Notes (Signed)
New Patient Visit  Assessment: Annette Washington is a 64 y.o. RHD female with the following: Carpal tunnel, right wrist  Plan: Mrs. Boissonneault has moderate to severe carpal tunnel syndrome, which has been confirmed with an EMG.  She has been wearing a cock-up wrist splint at night, which has not completely resolved her symptoms.  Despite the improvements in her symptoms, she has dropped items due to the pain and numbness.  As result, I do feel that she is a good candidate for carpal tunnel release.  After discussing the procedure including the risks and benefits which are not limited to infection, bleeding, need for further surgery, recurrence of her symptoms, incomplete resolution of her symptoms and more severe complications associated with anesthesia, she has elected to proceed.  The case will be posted, and she will be contacted for appropriate preoperative testing.  All questions were answered and she is amenable to this plan.  Surgical Plan  Procedure:  Right carpal tunnel release Disposition: Outpatient Anesthesia: [facility] Comorbidities: DM (borderline, on medications), hypertension  Follow-up: Return for After surgery; DOS 06/18/20, f/u 10-14 days after .  Subjective:  Chief Complaint  Patient presents with  . Hand Pain    Right hand, CTS.    History of Present Illness: Annette Washington is a 64 y.o. female who presents for evaluation of right hand pain and tingling.  She has had worsening symptoms for the past 3 months.  She states that it started with significant pain, numbness and tingling in the right hand which caused her to present to the emergency department.  X-rays were negative.  Since then, she is ongoing symptoms.  These have improved a little bit.  She has been wearing a wrist splint at night, which reduces the severity of her symptoms.  The pain and tingling still occasionally wake her up at night.  She denies trauma to her wrist or her neck.  No symptoms  currently in her left wrist.  At time, her symptoms have been severe enough that she has been dropping items.   Chief Complaint  Patient presents with  . Hand Pain    Right hand, CTS.    Review of Systems: No fevers or chills No chest pain No shortness of breath No bowel or bladder dysfunction No GI distress No headaches   Medical History:  Past Medical History:  Diagnosis Date  . Diabetes mellitus without complication ([place])   . Hypertension   . Pneumonia     Past Surgical History:  Procedure Laterality Date  . CATARACT EXTRACTION W/PHACO Left 04/12/2018   Procedure: CATARACT EXTRACTION PHACO AND INTRAOCULAR LENS PLACEMENT (IOC);  Surgeon: Tonny Branch, MD;  Location: [facility];  Service: Ophthalmology;  Laterality: Left;  CDE:  7.79  . CATARACT EXTRACTION W/PHACO Right 04/26/2018   Procedure: CATARACT EXTRACTION PHACO AND INTRAOCULAR LENS PLACEMENT RIGHT EYE CDE=6.51;  Surgeon: Tonny Branch, MD;  Location: [facility];  Service: Ophthalmology;  Laterality: Right;  right  . COLONOSCOPY N/A 02/12/2017   Procedure: COLONOSCOPY;  Surgeon: Rogene Houston, MD;  Location: [facility];  Service: Endoscopy;  Laterality: N/A;  730  . COLONOSCOPY WITH PROPOFOL N/A 04/18/2020   Procedure: COLONOSCOPY WITH PROPOFOL;  Surgeon: Rogene Houston, MD;  Location: [facility];  Service: Endoscopy;  Laterality: N/A;  830  . TONSILLECTOMY      Family History  Problem Relation Age of Onset  . Kidney disease Mother   . Breast cancer Maternal Grandmother  Social History   Tobacco Use  . Smoking status: Former Smoker    Types: Cigarettes  . Smokeless tobacco: Never Used  Substance Use Topics  . Alcohol use: No  . Drug use: No    Allergies  Allergen Reactions  . Tylenol [Acetaminophen] Rash    Current Meds  Medication Sig  . amLODipine (NORVASC) 5 MG tablet Take 5 mg by mouth daily.  Marland Kitchen ascorbic acid (VITAMIN C) 500 MG tablet Take 500 mg by mouth daily.  . baclofen (LIORESAL) 10  MG tablet Take 10 mg by mouth daily as needed for muscle spasms.   . cetirizine (ZYRTEC) 10 MG tablet Take 10 mg by mouth daily as needed for allergies.  . cholecalciferol (VITAMIN D3) 25 MCG (1000 UNIT) tablet Take 1,000 Units by mouth daily.  Marland Kitchen ELDERBERRY PO Take 1 capsule by mouth daily.  . fluticasone (FLONASE) 50 MCG/ACT nasal spray Place 2 sprays into both nostrils daily as needed for allergies or rhinitis.  Marland Kitchen losartan-hydrochlorothiazide (HYZAAR) 50-12.5 MG tablet Take 1 tablet by mouth daily.  . meloxicam (MOBIC) 7.5 MG tablet Take 7.5 mg by mouth daily as needed for pain.  . metFORMIN (GLUCOPHAGE) 500 MG tablet Take 500 mg by mouth at bedtime.   . naproxen sodium (ALEVE) 220 MG tablet Take 440 mg by mouth daily as needed (pain).  . pravastatin (PRAVACHOL) 10 MG tablet Take 10 mg by mouth daily.    Objective: BP (!) 150/78   Pulse 75   Ht 5\' 1"  (1.549 m)   Wt 203 lb (92.1 kg)   BMI 38.36 kg/m   Physical Exam:  General: Alert and oriented, no acute distress Gait: Normal  Evaluation of the right wrist and hand demonstrates no obvious deformity.  She has full and painless range of motion.  Positive Tinel's and carpal tunnel compression test.  Grip strength is 5/5.  She has excellent strength in the thenar musculature.  No wasting of the hand muscles is appreciated    IMAGING: No new imaging obtained today   New Medications:  No orders of the defined types were placed in this encounter.     [name], MD  05/11/2020 12:41 PM

## 2020-06-13 ENCOUNTER — Telehealth: Payer: Self-pay | Admitting: Orthopedic Surgery

## 2020-06-13 NOTE — Telephone Encounter (Signed)
I have submitted the information to [employer] through [employer]. I will check on the response at a later time.

## 2020-06-13 NOTE — Patient Instructions (Signed)
Annette Washington  [id number]     @PREFPERIOPPHARMACY @   Your procedure is scheduled on  06/18/2020.  Report to [facility] at  [phone]  A.M.  Call this number if you have problems the morning of surgery:  [phone]   Remember:  Do not eat or drink after midnight.                       Take these medicines the morning of surgery with A SIP OF WATER  Amlodipine, baclofen, zyrtec, mobic.    Do not wear jewelry, make-up or nail polish.  Do not wear lotions, powders, or perfumes. Please wear deodorant and brush your teeth.  Do not shave 48 hours prior to surgery.  Men may shave face and neck.  Do not bring valuables to the hospital.  [facility] is not responsible for any belongings or valuables.  Contacts, dentures or bridgework may not be worn into surgery.  Leave your suitcase in the car.  After surgery it may be brought to your room.  For patients admitted to the hospital, discharge time will be determined by your treatment team.  Patients discharged the day of surgery will not be allowed to drive home.   Name and phone number of your driver:   family Special instructions:  DO NOT smoke the morning of your procedure.  Please read over the following fact sheets that you were given. Anesthesia Post-op Instructions and Care and Recovery After Surgery       Open Carpal Tunnel Release, Care After This sheet gives you information about how to care for yourself after your procedure. Your health care provider may also give you more specific instructions. If you have problems or questions, contact your health care provider. What can I expect after the procedure? After the procedure, it is common to have:  Wrist stiffness.  Bruising. Follow these instructions at home: Bathing  Do not take baths, swim, or use a hot tub until your health care provider approves. Ask your health care provider if you may take showers.  Keep your bandage (dressing) dry until your  health care provider says it can be removed. If you have a splint or brace:  Wear the splint or brace as told by your health care provider. You may need to wear it for 2-3 weeks. Remove it only as told by your health care provider.  Loosen the splint or brace if your fingers tingle, become numb, or turn cold and blue.  Keep the splint or brace clean.  If the splint or brace is not waterproof: ? Do not let it get wet. ? Cover it with a watertight covering when you take a bath or a shower. Incision care   Follow instructions from your health care provider about how to take care of your incision. Make sure you: ? Wash your hands with soap and water before you change your dressing. If soap and water are not available, use hand sanitizer. ? Change your dressing as told by your health care provider. ? Leave stitches (sutures), skin glue, or adhesive strips in place. These skin closures may need to stay in place for 2 weeks or longer. If adhesive strip edges start to loosen and curl up, you may trim the loose edges. Do not remove adhesive strips completely unless your health care provider tells you to do that.  Check your incision area every day for signs of infection. Check  for: ? Redness, swelling, or pain. ? Fluid or blood. ? Warmth. ? Pus or a bad smell. Managing pain, stiffness, and swelling   If directed, put ice on the affected area. ? If you have a removable splint or brace, remove it as told by your health care provider. ? Put ice in a plastic bag. ? Place a towel between your skin and the bag. ? Leave the ice on for 20 minutes, 2-3 times a day.  Move your fingers often to avoid stiffness and to lessen swelling.  Raise (elevate) your wrist above the level of your heart while you are sitting or lying down. Activity  Do not drive until your health care provider approves.  Do not drive or use heavy machinery while taking prescription pain medicine.  Return to your normal  activities as told by your health care provider. Avoid activities that cause pain.  If physical therapy was prescribed, do exercises as told by your therapist. Physical therapy can help you heal faster and regain movement. General instructions  Take over-the-counter and prescription medicines only as told by your health care provider.  If you are taking prescription pain medicine, take actions to prevent or treat constipation. Your health care provider may recommend that you: ? Drink enough fluid to keep your urine pale yellow. ? Eat foods that are high in fiber, such as fresh fruits and vegetables, whole grains, and beans. ? Limit foods that are high in fat and processed sugars, such as fried or sweet foods. ? Take an over-the-counter or prescription medicine for constipation.  Do not use any products that contain nicotine or tobacco, such as cigarettes and e-cigarettes. If you need help quitting, ask your health care provider.  Keep all follow-up visits as told by your health care provider and physical therapist. This is important. Contact a health care provider if:  You have redness or swelling around your incision.  You have fluid or blood coming from your incision.  Your incision feels warm to the touch.  You have pus or a bad smell coming from your incision.  You have a fever.  You have chills.  You have pain that does not get better with medicine.  Your carpal tunnel symptoms do not go away after 2 months.  Your carpal tunnel symptoms go away and then come back. Get help right away if:  You have pain or numbness that is getting worse.  Your fingers or fingertips become very pale or bluish in color.  You are not able to move your fingers. Summary  It is common to have wrist stiffness and bruising after a carpal tunnel release.  Icing and raising (elevating) your wrist may help to lessen swelling and pain.  Call your health care provider if you have a fever or  notice any signs of infection in your incision area. This information is not intended to replace advice given to you by your health care provider. Make sure you discuss any questions you have with your health care provider. Document Revised: 05/29/2017 Document Reviewed: 02/23/2017 Elsevier Patient Education  2020 [place] After These instructions provide you with information about caring for yourself after your procedure. Your health care provider may also give you more specific instructions. Your treatment has been planned according to current medical practices, but problems sometimes occur. Call your health care provider if you have any problems or questions after your procedure. What can I expect after the procedure? After your procedure,  you may:  Feel sleepy for several hours.  Feel clumsy and have poor balance for several hours.  Feel forgetful about what happened after the procedure.  Have poor judgment for several hours.  Feel nauseous or vomit.  Have a sore throat if you had a breathing tube during the procedure. Follow these instructions at home: For at least 24 hours after the procedure:      Have a responsible adult stay with you. It is important to have someone help care for you until you are awake and alert.  Rest as needed.  Do not: ? Participate in activities in which you could fall or become injured. ? Drive. ? Use heavy machinery. ? Drink alcohol. ? Take sleeping pills or medicines that cause drowsiness. ? Make important decisions or sign legal documents. ? Take care of children on your own. Eating and drinking  Follow the diet that is recommended by your health care provider.  If you vomit, drink water, juice, or soup when you can drink without vomiting.  Make sure you have little or no nausea before eating solid foods. General instructions  Take over-the-counter and prescription medicines only as told by your  health care provider.  If you have sleep apnea, surgery and certain medicines can increase your risk for breathing problems. Follow instructions from your health care provider about wearing your sleep device: ? Anytime you are sleeping, including during daytime naps. ? While taking prescription pain medicines, sleeping medicines, or medicines that make you drowsy.  If you smoke, do not smoke without supervision.  Keep all follow-up visits as told by your health care provider. This is important. Contact a health care provider if:  You keep feeling nauseous or you keep vomiting.  You feel light-headed.  You develop a rash.  You have a fever. Get help right away if:  You have trouble breathing. Summary  For several hours after your procedure, you may feel sleepy and have poor judgment.  Have a responsible adult stay with you for at least 24 hours or until you are awake and alert. This information is not intended to replace advice given to you by your health care provider. Make sure you discuss any questions you have with your health care provider. Document Revised: 09/14/2017 Document Reviewed: 10/07/2015 Elsevier Patient Education  [place]. How to Use Chlorhexidine for Bathing Chlorhexidine gluconate (CHG) is a germ-killing (antiseptic) solution that is used to clean the skin. It can get rid of the bacteria that normally live on the skin and can keep them away for about 24 hours. To clean your skin with CHG, you may be given:  A CHG solution to use in the shower or as part of a sponge bath.  A prepackaged cloth that contains CHG. Cleaning your skin with CHG may help lower the risk for infection:  While you are staying in the intensive care unit of the hospital.  If you have a vascular access, such as a central line, to provide short-term or long-term access to your veins.  If you have a catheter to drain urine from your bladder.  If you are on a ventilator. A  ventilator is a machine that helps you breathe by moving air in and out of your lungs.  After surgery. What are the risks? Risks of using CHG include:  A skin reaction.  Hearing loss, if CHG gets in your ears.  Eye injury, if CHG gets in your eyes and is not rinsed out.  The CHG product catching fire. Make sure that you avoid smoking and flames after applying CHG to your skin. Do not use CHG:  If you have a chlorhexidine allergy or have previously reacted to chlorhexidine.  On babies younger than 27 months of age. How to use CHG solution  Use CHG only as told by your health care provider, and follow the instructions on the label.  Use the full amount of CHG as directed. Usually, this is one bottle. During a shower Follow these steps when using CHG solution during a shower (unless your health care provider gives you different instructions): 1. Start the shower. 2. Use your normal soap and shampoo to wash your face and hair. 3. Turn off the shower or move out of the shower stream. 4. Pour the CHG onto a clean washcloth. Do not use any type of brush or rough-edged sponge. 5. Starting at your neck, lather your body down to your toes. Make sure you follow these instructions: ? If you will be having surgery, pay special attention to the part of your body where you will be having surgery. Scrub this area for at least 1 minute. ? Do not use CHG on your head or face. If the solution gets into your ears or eyes, rinse them well with water. ? Avoid your genital area. ? Avoid any areas of skin that have broken skin, cuts, or scrapes. ? Scrub your back and under your arms. Make sure to wash skin folds. 6. Let the lather sit on your skin for 1-2 minutes or as long as told by your health care provider. 7. Thoroughly rinse your entire body in the shower. Make sure that all body creases and crevices are rinsed well. 8. Dry off with a clean towel. Do not put any substances on your body  afterward--such as powder, lotion, or perfume--unless you are told to do so by your health care provider. Only use lotions that are recommended by the manufacturer. 9. Put on clean clothes or pajamas. 10. If it is the night before your surgery, sleep in clean sheets.  During a sponge bath Follow these steps when using CHG solution during a sponge bath (unless your health care provider gives you different instructions): 1. Use your normal soap and shampoo to wash your face and hair. 2. Pour the CHG onto a clean washcloth. 3. Starting at your neck, lather your body down to your toes. Make sure you follow these instructions: ? If you will be having surgery, pay special attention to the part of your body where you will be having surgery. Scrub this area for at least 1 minute. ? Do not use CHG on your head or face. If the solution gets into your ears or eyes, rinse them well with water. ? Avoid your genital area. ? Avoid any areas of skin that have broken skin, cuts, or scrapes. ? Scrub your back and under your arms. Make sure to wash skin folds. 4. Let the lather sit on your skin for 1-2 minutes or as long as told by your health care provider. 5. Using a different clean, wet washcloth, thoroughly rinse your entire body. Make sure that all body creases and crevices are rinsed well. 6. Dry off with a clean towel. Do not put any substances on your body afterward--such as powder, lotion, or perfume--unless you are told to do so by your health care provider. Only use lotions that are recommended by the manufacturer. 7. Put on clean clothes or pajamas.  8. If it is the night before your surgery, sleep in clean sheets. How to use CHG prepackaged cloths  Only use CHG cloths as told by your health care provider, and follow the instructions on the label.  Use the CHG cloth on clean, dry skin.  Do not use the CHG cloth on your head or face unless your health care provider tells you to.  When washing with  the CHG cloth: ? Avoid your genital area. ? Avoid any areas of skin that have broken skin, cuts, or scrapes. Before surgery Follow these steps when using a CHG cloth to clean before surgery (unless your health care provider gives you different instructions): 1. Using the CHG cloth, vigorously scrub the part of your body where you will be having surgery. Scrub using a back-and-forth motion for 3 minutes. The area on your body should be completely wet with CHG when you are done scrubbing. 2. Do not rinse. Discard the cloth and let the area air-dry. Do not put any substances on the area afterward, such as powder, lotion, or perfume. 3. Put on clean clothes or pajamas. 4. If it is the night before your surgery, sleep in clean sheets.  For general bathing Follow these steps when using CHG cloths for general bathing (unless your health care provider gives you different instructions). 1. Use a separate CHG cloth for each area of your body. Make sure you wash between any folds of skin and between your fingers and toes. Wash your body in the following order, switching to a new cloth after each step: ? The front of your neck, shoulders, and chest. ? Both of your arms, under your arms, and your hands. ? Your stomach and groin area, avoiding the genitals. ? Your right leg and foot. ? Your left leg and foot. ? The back of your neck, your back, and your buttocks. 2. Do not rinse. Discard the cloth and let the area air-dry. Do not put any substances on your body afterward--such as powder, lotion, or perfume--unless you are told to do so by your health care provider. Only use lotions that are recommended by the manufacturer. 3. Put on clean clothes or pajamas. Contact a health care provider if:  Your skin gets irritated after scrubbing.  You have questions about using your solution or cloth. Get help right away if:  Your eyes become very red or swollen.  Your eyes itch badly.  Your skin itches badly  and is red or swollen.  Your hearing changes.  You have trouble seeing.  You have swelling or tingling in your mouth or throat.  You have trouble breathing.  You swallow any chlorhexidine. Summary  Chlorhexidine gluconate (CHG) is a germ-killing (antiseptic) solution that is used to clean the skin. Cleaning your skin with CHG may help to lower your risk for infection.  You may be given CHG to use for bathing. It may be in a bottle or in a prepackaged cloth to use on your skin. Carefully follow your health care provider's instructions and the instructions on the product label.  Do not use CHG if you have a chlorhexidine allergy.  Contact your health care provider if your skin gets irritated after scrubbing. This information is not intended to replace advice given to you by your health care provider. Make sure you discuss any questions you have with your health care provider. Document Revised: 09/02/2018 Document Reviewed: 05/14/2017 Elsevier Patient Education  [place].

## 2020-06-14 ENCOUNTER — Encounter: Payer: Self-pay | Admitting: Radiology

## 2020-06-14 NOTE — Progress Notes (Signed)
Auth for CTR surgery pending, notes faxed in today [phone]. Ref # [id number]. Valid dates to be 06/14/20 through 07/14/20 if approved.  [employer] rep will call me back to advise on this. FYI only, thanks!

## 2020-06-15 ENCOUNTER — Other Ambulatory Visit (HOSPITAL_COMMUNITY)
Admission: RE | Admit: 2020-06-15 | Discharge: 2020-06-15 | Disposition: A | Discharge status: Home / Self Care | Payer: Medicare HMO | Source: Ambulatory Visit | Attending: Orthopedic Surgery | Admitting: Orthopedic Surgery

## 2020-06-15 ENCOUNTER — Encounter (HOSPITAL_COMMUNITY): Payer: Self-pay

## 2020-06-15 ENCOUNTER — Other Ambulatory Visit: Payer: Self-pay

## 2020-06-15 ENCOUNTER — Encounter (HOSPITAL_COMMUNITY)
Admission: RE | Admit: 2020-06-15 | Discharge: 2020-06-15 | Disposition: A | Discharge status: Home / Self Care | Payer: Medicare HMO | Source: Ambulatory Visit | Attending: Orthopedic Surgery | Admitting: Orthopedic Surgery

## 2020-06-15 DIAGNOSIS — Z01812 Encounter for preprocedural laboratory examination: Secondary | ICD-10-CM | POA: Diagnosis not present

## 2020-06-15 DIAGNOSIS — Z20822 Contact with and (suspected) exposure to covid-19: Secondary | ICD-10-CM | POA: Insufficient documentation

## 2020-06-15 LAB — CBC
HCT: 37 % (ref 36.0–46.0)
Hemoglobin: 12.1 g/dL (ref 12.0–15.0)
MCH: 29.7 pg (ref 26.0–34.0)
MCHC: 32.7 g/dL (ref 30.0–36.0)
MCV: 90.9 fL (ref 80.0–100.0)
Platelets: 279 10*3/uL (ref 150–400)
RBC: 4.07 MIL/uL (ref 3.87–5.11)
RDW: 13.1 % (ref 11.5–15.5)
WBC: 10.2 10*3/uL (ref 4.0–10.5)
nRBC: 0 % (ref 0.0–0.2)

## 2020-06-15 LAB — BASIC METABOLIC PANEL
Anion gap: 8 (ref 5–15)
BUN: 17 mg/dL (ref 8–23)
CO2: 27 mmol/L (ref 22–32)
Calcium: 9.7 mg/dL (ref 8.9–10.3)
Chloride: 104 mmol/L (ref 98–111)
Creatinine, Ser: 0.72 mg/dL (ref 0.44–1.00)
GFR, Estimated: >60 mL/min (ref >60)
Glucose, Bld: 106 mg/dL — ABNORMAL HIGH (ref 70–99)
Potassium: 3.4 mmol/L — ABNORMAL LOW (ref 3.5–5.1)
Sodium: 139 mmol/L (ref 135–145)

## 2020-06-15 LAB — HEMOGLOBIN A1C
Hgb A1c MFr Bld: 6 % — ABNORMAL HIGH (ref 4.8–5.6)
Mean Plasma Glucose: 125.5 mg/dL

## 2020-06-15 LAB — SARS CORONAVIRUS 2 (TAT 6-24 HRS): SARS Coronavirus 2: NEGATIVE

## 2020-06-18 ENCOUNTER — Encounter (HOSPITAL_COMMUNITY)
Admission: RE | Disposition: A | Discharge status: Home / Self Care | Payer: Self-pay | Source: Home / Self Care | Attending: Orthopedic Surgery

## 2020-06-18 ENCOUNTER — Encounter (HOSPITAL_COMMUNITY): Payer: Self-pay | Admitting: Orthopedic Surgery

## 2020-06-18 ENCOUNTER — Ambulatory Visit (HOSPITAL_COMMUNITY): Payer: Medicare HMO | Admitting: Anesthesiology

## 2020-06-18 ENCOUNTER — Ambulatory Visit (HOSPITAL_COMMUNITY)
Admission: RE | Admit: 2020-06-18 | Discharge: 2020-06-18 | Disposition: A | Discharge status: Home / Self Care | Payer: Medicare HMO | Attending: Orthopedic Surgery | Admitting: Orthopedic Surgery

## 2020-06-18 DIAGNOSIS — Z7984 Long term (current) use of oral hypoglycemic drugs: Secondary | ICD-10-CM | POA: Diagnosis not present

## 2020-06-18 DIAGNOSIS — Z87891 Personal history of nicotine dependence: Secondary | ICD-10-CM | POA: Diagnosis not present

## 2020-06-18 DIAGNOSIS — Z886 Allergy status to analgesic agent status: Secondary | ICD-10-CM | POA: Diagnosis not present

## 2020-06-18 DIAGNOSIS — Z79899 Other long term (current) drug therapy: Secondary | ICD-10-CM | POA: Diagnosis not present

## 2020-06-18 DIAGNOSIS — G5601 Carpal tunnel syndrome, right upper limb: Secondary | ICD-10-CM | POA: Insufficient documentation

## 2020-06-18 HISTORY — PX: CARPAL TUNNEL RELEASE: SHX101

## 2020-06-18 LAB — GLUCOSE, CAPILLARY
Glucose-Capillary: 125 mg/dL — ABNORMAL HIGH (ref 70–99)
Glucose-Capillary: 106 mg/dL — ABNORMAL HIGH (ref 70–99)

## 2020-06-18 SURGERY — CARPAL TUNNEL RELEASE
Anesthesia: Monitor Anesthesia Care | Site: Wrist | Laterality: Right

## 2020-06-18 MED ORDER — PROPOFOL 500 MG/50ML IV EMUL
INTRAVENOUS | Status: DC | PRN
  Administered 2020-06-18: 50 ug/kg/min via INTRAVENOUS

## 2020-06-18 MED ORDER — CEFAZOLIN SODIUM-DEXTROSE 2-4 GM/100ML-% IV SOLN
2.0000 g | INTRAVENOUS | Status: AC
  Administered 2020-06-18: 08:00:00 2 g via INTRAVENOUS
  Filled 2020-06-18: qty 100

## 2020-06-18 MED ORDER — LIDOCAINE HCL (PF) 0.5 % IJ SOLN
INTRAMUSCULAR | Status: DC | PRN
  Administered 2020-06-18: 40 mL via INTRAVENOUS

## 2020-06-18 MED ORDER — BUPIVACAINE HCL (PF) 0.5 % IJ SOLN
INTRAMUSCULAR | Status: AC
  Filled 2020-06-18: qty 30

## 2020-06-18 MED ORDER — MEPERIDINE HCL 50 MG/ML IJ SOLN
6.2500 mg | INTRAMUSCULAR | Status: DC | PRN
Start: 2020-06-18 — End: 2020-06-18

## 2020-06-18 MED ORDER — LIDOCAINE HCL (PF) 0.5 % IJ SOLN
INTRAMUSCULAR | Status: AC
  Filled 2020-06-18: qty 50

## 2020-06-18 MED ORDER — ONDANSETRON HCL 4 MG/2ML IJ SOLN
INTRAMUSCULAR | Status: DC | PRN
  Administered 2020-06-18: 4 mg via INTRAVENOUS

## 2020-06-18 MED ORDER — FENTANYL CITRATE (PF) 100 MCG/2ML IJ SOLN
INTRAMUSCULAR | Status: DC | PRN
  Administered 2020-06-18 (×2): 50 ug via INTRAVENOUS

## 2020-06-18 MED ORDER — ORAL CARE MOUTH RINSE: 15.0000 mL | Freq: Once | OROMUCOSAL | Status: AC

## 2020-06-18 MED ORDER — LACTATED RINGERS IV SOLN: Freq: Once | INTRAVENOUS | Status: AC

## 2020-06-18 MED ORDER — KETOROLAC TROMETHAMINE 30 MG/ML IJ SOLN
INTRAMUSCULAR | Status: AC
  Filled 2020-06-18: qty 1

## 2020-06-18 MED ORDER — CHLORHEXIDINE GLUCONATE 0.12 % MT SOLN
OROMUCOSAL | Status: AC
  Filled 2020-06-18: qty 15

## 2020-06-18 MED ORDER — MIDAZOLAM HCL 5 MG/5ML IJ SOLN
INTRAMUSCULAR | Status: DC | PRN
  Administered 2020-06-18: 2 mg via INTRAVENOUS

## 2020-06-18 MED ORDER — LIDOCAINE HCL (PF) 2 % IJ SOLN
INTRAMUSCULAR | Status: AC
  Filled 2020-06-18: qty 5

## 2020-06-18 MED ORDER — OXYCODONE HCL 5 MG PO TABS: 5.0000 mg | ORAL_TABLET | ORAL | 0 refills | Status: AC | PRN

## 2020-06-18 MED ORDER — LACTATED RINGERS IV SOLN: INTRAVENOUS | Status: DC | PRN

## 2020-06-18 MED ORDER — PROPOFOL 10 MG/ML IV BOLUS
INTRAVENOUS | Status: DC | PRN
  Administered 2020-06-18: 20 mg via INTRAVENOUS
  Administered 2020-06-18: 30 mg via INTRAVENOUS

## 2020-06-18 MED ORDER — FENTANYL CITRATE (PF) 100 MCG/2ML IJ SOLN
INTRAMUSCULAR | Status: AC
  Filled 2020-06-18: qty 2

## 2020-06-18 MED ORDER — CHLORHEXIDINE GLUCONATE 0.12 % MT SOLN
15.0000 mL | Freq: Once | OROMUCOSAL | Status: AC
  Administered 2020-06-18: 08:00:00 15 mL via OROMUCOSAL

## 2020-06-18 MED ORDER — PROPOFOL 10 MG/ML IV BOLUS
INTRAVENOUS | Status: AC
  Filled 2020-06-18: qty 20

## 2020-06-18 MED ORDER — PROMETHAZINE HCL 25 MG/ML IJ SOLN: 6.2500 mg | INTRAMUSCULAR | Status: DC | PRN

## 2020-06-18 MED ORDER — ONDANSETRON HCL 4 MG/2ML IJ SOLN
INTRAMUSCULAR | Status: AC
  Filled 2020-06-18: qty 2

## 2020-06-18 MED ORDER — 0.9 % SODIUM CHLORIDE (POUR BTL) OPTIME
TOPICAL | Status: DC | PRN
  Administered 2020-06-18: 08:00:00 1000 mL

## 2020-06-18 MED ORDER — LIDOCAINE 2% (20 MG/ML) 5 ML SYRINGE
INTRAMUSCULAR | Status: DC | PRN
  Administered 2020-06-18: 40 mg via INTRAVENOUS

## 2020-06-18 MED ORDER — MIDAZOLAM HCL 2 MG/2ML IJ SOLN
INTRAMUSCULAR | Status: AC
  Filled 2020-06-18: qty 2

## 2020-06-18 MED ORDER — KETOROLAC TROMETHAMINE 30 MG/ML IJ SOLN
INTRAMUSCULAR | Status: DC | PRN
  Administered 2020-06-18: 30 mg via INTRAVENOUS

## 2020-06-18 MED ORDER — BUPIVACAINE HCL (PF) 0.5 % IJ SOLN
INTRAMUSCULAR | Status: DC | PRN
  Administered 2020-06-18: 10 mL

## 2020-06-18 MED ORDER — PROPOFOL 10 MG/ML IV BOLUS
INTRAVENOUS | Status: AC
  Filled 2020-06-18: qty 40

## 2020-06-18 MED ORDER — HYDROMORPHONE HCL 1 MG/ML IJ SOLN: 0.2500 mg | INTRAMUSCULAR | Status: DC | PRN

## 2020-06-18 SURGICAL SUPPLY — 52 items
ADH SKN CLS APL DERMABOND .7 (GAUZE/BANDAGES/DRESSINGS) ×1
APL PRP STRL LF DISP 70% ISPRP (MISCELLANEOUS) ×1
BANDAGE ELASTIC 3 LF NS (GAUZE/BANDAGES/DRESSINGS) ×3 IMPLANT
BANDAGE ESMARK 4X12 BL STRL LF (DISPOSABLE) ×1 IMPLANT
BLADE SURG 15 STRL LF DISP TIS (BLADE) ×1 IMPLANT
BLADE SURG 15 STRL SS (BLADE) ×3
BNDG CMPR 12X4 ELC STRL LF (DISPOSABLE) ×1
BNDG CMPR MED 5X3 ELC HKLP NS (GAUZE/BANDAGES/DRESSINGS) ×1
BNDG CMPR STD VLCR NS LF 5.8X3 (GAUZE/BANDAGES/DRESSINGS) ×1
BNDG COHESIVE 4X5 TAN STRL (GAUZE/BANDAGES/DRESSINGS) ×3 IMPLANT
BNDG ELASTIC 3X5.8 VLCR NS LF (GAUZE/BANDAGES/DRESSINGS) ×3 IMPLANT
BNDG ELASTIC 3X5.8 VLCR STR LF (GAUZE/BANDAGES/DRESSINGS) ×3 IMPLANT
BNDG ESMARK 4X12 BLUE STRL LF (DISPOSABLE) ×3
BNDG GAUZE ELAST 4 BULKY (GAUZE/BANDAGES/DRESSINGS) ×3 IMPLANT
CHLORAPREP W/TINT 26 (MISCELLANEOUS) ×3 IMPLANT
CLOTH BEACON ORANGE TIMEOUT ST (SAFETY) ×3 IMPLANT
COVER LIGHT HANDLE STERIS (MISCELLANEOUS) ×6 IMPLANT
COVER WAND RF STERILE (DRAPES) ×3 IMPLANT
CUFF TOURN SGL QUICK 18X4 (TOURNIQUET CUFF) ×3 IMPLANT
DECANTER SPIKE VIAL GLASS SM (MISCELLANEOUS) ×3 IMPLANT
DERMABOND ADVANCED (GAUZE/BANDAGES/DRESSINGS) ×2
DERMABOND ADVANCED .7 DNX12 (GAUZE/BANDAGES/DRESSINGS) ×1 IMPLANT
DRAPE HALF SHEET 40X57 (DRAPES) ×3 IMPLANT
DRSG TEGADERM 4X4.75 (GAUZE/BANDAGES/DRESSINGS) ×3 IMPLANT
DRSG XEROFORM 1X8 (GAUZE/BANDAGES/DRESSINGS) ×3 IMPLANT
ELECT NEEDLE TIP 2.8 STRL (NEEDLE) ×3 IMPLANT
ELECT REM PT RETURN 9FT ADLT (ELECTROSURGICAL) ×3
ELECTRODE REM PT RTRN 9FT ADLT (ELECTROSURGICAL) ×1 IMPLANT
GAUZE SPONGE 4X4 12PLY STRL (GAUZE/BANDAGES/DRESSINGS) ×3 IMPLANT
GAUZE XEROFORM 1X8 LF (GAUZE/BANDAGES/DRESSINGS) ×3 IMPLANT
GLOVE BIOGEL PI IND STRL 7.0 (GLOVE) ×2 IMPLANT
GLOVE BIOGEL PI IND STRL 8 (GLOVE) ×1 IMPLANT
GLOVE BIOGEL PI INDICATOR 7.0 (GLOVE) ×4
GLOVE BIOGEL PI INDICATOR 8 (GLOVE) ×2
GLOVE SKINSENSE NS SZ8.0 LF (GLOVE) ×6
GLOVE SKINSENSE STRL SZ8.0 LF (GLOVE) ×3 IMPLANT
GOWN STRL REUS W/ TWL XL LVL3 (GOWN DISPOSABLE) ×1 IMPLANT
GOWN STRL REUS W/TWL LRG LVL3 (GOWN DISPOSABLE) ×3 IMPLANT
GOWN STRL REUS W/TWL XL LVL3 (GOWN DISPOSABLE) ×3
KIT TURNOVER KIT A (KITS) ×3 IMPLANT
MANIFOLD NEPTUNE II (INSTRUMENTS) ×3 IMPLANT
NEEDLE HYPO 21X1.5 SAFETY (NEEDLE) ×3 IMPLANT
NS IRRIG 1000ML POUR BTL (IV SOLUTION) ×3 IMPLANT
PACK BASIC LIMB (CUSTOM PROCEDURE TRAY) ×3 IMPLANT
PAD ARMBOARD 7.5X6 YLW CONV (MISCELLANEOUS) ×3 IMPLANT
POSITIONER HAND ALUMI XLG (MISCELLANEOUS) ×3 IMPLANT
SET BASIN LINEN APH (SET/KITS/TRAYS/PACK) ×3 IMPLANT
SPONGE GAUZE 2X2 8PLY STER LF (GAUZE/BANDAGES/DRESSINGS) ×1
SPONGE GAUZE 2X2 8PLY STRL LF (GAUZE/BANDAGES/DRESSINGS) ×2 IMPLANT
SUT ETHILON 3 0 FSL (SUTURE) ×3 IMPLANT
SUT MON AB 3-0 SH 27 (SUTURE) ×3 IMPLANT
SYR CONTROL 10ML LL (SYRINGE) ×3 IMPLANT

## 2020-06-18 NOTE — Transfer of Care (Signed)
Immediate Anesthesia Transfer of Care Note  Patient: Annette Washington  Procedure(s) Performed: CARPAL TUNNEL RELEASE (Right Wrist)  Patient Location: PACU  Anesthesia Type:MAC and Regional  Level of Consciousness: awake, alert  and oriented  Airway & Oxygen Therapy: Patient Spontanous Breathing  Post-op Assessment: Report given to RN and Post -op Vital signs reviewed and stable  Post vital signs: Reviewed and stable  Last Vitals:  Vitals Value Taken Time  BP 128/68 06/18/20 0839  Temp    Pulse 65 06/18/20 0840  Resp 17 06/18/20 0840  SpO2 95 % 06/18/20 0840  Vitals shown include unvalidated device data.  Last Pain:  Vitals:   06/18/20 0651  PainSc: 0-No pain         Complications: No complications documented.

## 2020-06-18 NOTE — Interval H&P Note (Signed)
History and Physical Interval Note:  03/49/6116 4:35 AM  Annette Washington  has presented today for surgery, with the diagnosis of Right carpal tunnel syndrome.  The various methods of treatment have been discussed with the patient and family. After consideration of risks, benefits and other options for treatment, the patient has consented to  Procedure(s): CARPAL TUNNEL RELEASE (Right) as a surgical intervention.  The patient's history has been reviewed, patient examined, no change in status, stable for surgery.  I have reviewed the patient's chart and labs.  Questions were answered to the patient's satisfaction.    Continues to have pain, numbness and tingling in median nerve distribution to right hand.  Night time symptoms have improved a little bit recently.  Still wishes to proceed with surgery.    [name]

## 2020-06-18 NOTE — Anesthesia Postprocedure Evaluation (Signed)
Anesthesia Post Note  Patient: Annette Washington  Procedure(s) Performed: CARPAL TUNNEL RELEASE (Right Wrist)  Patient location during evaluation: PACU Anesthesia Type: MAC and Regional Level of consciousness: awake and alert and oriented Pain management: pain level controlled Vital Signs Assessment: post-procedure vital signs reviewed and stable Respiratory status: spontaneous breathing, nonlabored ventilation and respiratory function stable Cardiovascular status: blood pressure returned to baseline and stable Postop Assessment: no apparent nausea or vomiting Anesthetic complications: no   No complications documented.   Last Vitals:  Vitals:   06/18/20 0700 06/18/20 0845  BP: (!) 171/70 129/68  Pulse: 70 66  Resp: 19 18  Temp:  (!) 36.4 C  SpO2: 99% 97%    Last Pain:  Vitals:   06/18/20 0845  PainSc: 0-No pain                 Orlie Dakin

## 2020-06-18 NOTE — H&P (Signed)
Below is the most recent clinic note for Annette Washington; any pertinent information regarding their recent medical history will be updated on the day of surgery.   New Patient Visit  Assessment: Annette Washington is a 64 y.o. RHD female with the following: Carpal tunnel, right wrist  Plan: [name] has moderate to severe carpal tunnel syndrome, which has been confirmed with an EMG.  She has been wearing a cock-up wrist splint at night, which has not completely resolved her symptoms.  Despite the improvements in her symptoms, she has dropped items due to the pain and numbness.  As result, I do feel that she is a good candidate for carpal tunnel release.  After discussing the procedure including the risks and benefits which are not limited to infection, bleeding, need for further surgery, recurrence of her symptoms, incomplete resolution of her symptoms and more severe complications associated with anesthesia, she has elected to proceed.  The case will be posted, and she will be contacted for appropriate preoperative testing.  All questions were answered and she is amenable to this plan.  Surgical Plan  Procedure:  Right carpal tunnel release Disposition: Outpatient Anesthesia: [facility] Comorbidities: DM (borderline, on medications), hypertension  Follow-up: No follow-ups on file.  Subjective:  No chief complaint on file.   History of Present Illness: Annette Washington is a 64 y.o. female who presents for evaluation of right hand pain and tingling.  She has had worsening symptoms for the past 3 months.  She states that it started with significant pain, numbness and tingling in the right hand which caused her to present to the emergency department.  X-rays were negative.  Since then, she is ongoing symptoms.  These have improved a little bit.  She has been wearing a wrist splint at night, which reduces the severity of her symptoms.  The pain and tingling still occasionally  wake her up at night.  She denies trauma to her wrist or her neck.  No symptoms currently in her left wrist.  At time, her symptoms have been severe enough that she has been dropping items.   No chief complaint on file.   Review of Systems: No fevers or chills No chest pain No shortness of breath No bowel or bladder dysfunction No GI distress No headaches   Medical History:  Past Medical History:  Diagnosis Date  . Diabetes mellitus without complication ([place])   . Hypertension   . Pneumonia     Past Surgical History:  Procedure Laterality Date  . CATARACT EXTRACTION W/PHACO Left 04/12/2018   Procedure: CATARACT EXTRACTION PHACO AND INTRAOCULAR LENS PLACEMENT (IOC);  Surgeon: Tonny Branch, MD;  Location: [facility];  Service: Ophthalmology;  Laterality: Left;  CDE:  7.79  . CATARACT EXTRACTION W/PHACO Right 04/26/2018   Procedure: CATARACT EXTRACTION PHACO AND INTRAOCULAR LENS PLACEMENT RIGHT EYE CDE=6.51;  Surgeon: Tonny Branch, MD;  Location: [facility];  Service: Ophthalmology;  Laterality: Right;  right  . COLONOSCOPY N/A 02/12/2017   Procedure: COLONOSCOPY;  Surgeon: Rogene Houston, MD;  Location: [facility];  Service: Endoscopy;  Laterality: N/A;  730  . COLONOSCOPY WITH PROPOFOL N/A 04/18/2020   Procedure: COLONOSCOPY WITH PROPOFOL;  Surgeon: Rogene Houston, MD;  Location: [facility];  Service: Endoscopy;  Laterality: N/A;  830  . TONSILLECTOMY      Family History  Problem Relation Age of Onset  . Kidney disease Mother   . Breast cancer Maternal Grandmother    Social  History   Tobacco Use  . Smoking status: Former Smoker    Types: Cigarettes  . Smokeless tobacco: Never Used  Substance Use Topics  . Alcohol use: No  . Drug use: No    Allergies  Allergen Reactions  . Tylenol [Acetaminophen] Rash    Current Meds  Medication Sig  . amLODipine (NORVASC) 5 MG tablet Take 5 mg by mouth daily.  Marland Kitchen ascorbic acid (VITAMIN C) 500 MG tablet Take 500 mg by mouth  daily.  Marland Kitchen azelastine (ASTELIN) 0.1 % nasal spray Place 1 spray into both nostrils daily as needed for allergies.  . baclofen (LIORESAL) 10 MG tablet Take 10 mg by mouth daily as needed for muscle spasms.   . cetirizine (ZYRTEC) 10 MG tablet Take 10 mg by mouth daily as needed for allergies.  . cholecalciferol (VITAMIN D3) 25 MCG (1000 UNIT) tablet Take 1,000 Units by mouth daily.  Marland Kitchen ELDERBERRY PO Take 1 capsule by mouth daily.  . fluticasone (FLONASE) 50 MCG/ACT nasal spray Place 2 sprays into both nostrils daily as needed for allergies or rhinitis.  Marland Kitchen ibuprofen (ADVIL) 200 MG tablet Take 400 mg by mouth every 6 (six) hours as needed for mild pain or headache.  . losartan-hydrochlorothiazide (HYZAAR) 50-12.5 MG tablet Take 1 tablet by mouth daily.  . meloxicam (MOBIC) 7.5 MG tablet Take 7.5 mg by mouth daily as needed for pain.  . metFORMIN (GLUCOPHAGE) 500 MG tablet Take 500 mg by mouth at bedtime.   . pravastatin (PRAVACHOL) 10 MG tablet Take 10 mg by mouth daily.    Objective: There were no vitals taken for this visit.  Physical Exam:  General: Alert and oriented, no acute distress Gait: Normal  Evaluation of the right wrist and hand demonstrates no obvious deformity.  She has full and painless range of motion.  Positive Tinel's and carpal tunnel compression test.  Grip strength is 5/5.  She has excellent strength in the thenar musculature.  No wasting of the hand muscles is appreciated    IMAGING: No new imaging obtained today   New Medications:  Meds ordered this encounter  Medications  . ceFAZolin (ANCEF) IVPB 2g/100 mL premix    Order Specific Question:   Indication:    Answer:   Surgical Prophylaxis      [name], MD  06/18/2020 7:04 AM

## 2020-06-18 NOTE — Progress Notes (Signed)
Instructed on incentive spirometer. 1000 ml obtained. Tolerated well.

## 2020-06-18 NOTE — Anesthesia Procedure Notes (Signed)
Anesthesia Regional Block: Bier block (IV Regional)   Pre-Anesthetic Checklist: ,, timeout performed, Correct Patient, Correct Site, Correct Laterality, Correct Procedure, Correct Position, site marked, Risks and benefits discussed,  Surgical consent,  Pre-op evaluation,  At surgeon's request and post-op pain management  Laterality: Right and Upper  Prep: alcohol swabs        Procedures:,,,,,, Esmarch exsanguination, single tourniquet utilized,  Narrative:  Start time: 06/18/2020 7:44 AM End time: 06/18/2020 7:45 AM  Performed by: With CRNAs  Anesthesiologist: Denese Killings, MD CRNA: Orlie Dakin, CRNA  Additional Notes: 40 cc 0.5% xylocaine plain for Bier Block after right arm elevated, exsanguinated, tourn inflated.  Block performed without complication, tol well by patient, no complaints.

## 2020-06-18 NOTE — Anesthesia Procedure Notes (Signed)
Procedure Name: MAC Date/Time: 06/18/2020 7:48 AM Performed by: Orlie Dakin, CRNA Pre-anesthesia Checklist: Patient identified, Emergency Drugs available, Suction available and Patient being monitored Patient Re-evaluated:Patient Re-evaluated prior to induction Oxygen Delivery Method: Nasal cannula Induction Type: IV induction Placement Confirmation: positive ETCO2

## 2020-06-18 NOTE — Discharge Instructions (Signed)
  Annette A. Amedeo Kinsman, MD [facility] [address] [place],  [place]  [address] Phone: [phone] Fax: [phone]    POST-OPERATIVE INSTRUCTIONS  Limited prescription provided for oxycodone.  You can also take Ibuprofen OR Meloxicam for pain, but please do not take both at the same time.    WOUND CARE You may remove your bandage on postop day 3 and get the hand wet.  No ointments or lotions to be applied to the incision.  Do not submerge the incision for 1 month.  FOLLOW-UP If you develop a Fever (>101.5), Redness or Drainage from the surgical incision site, please call our office to arrange for an evaluation. Please call the office to schedule a follow-up appointment for your incision check if you do not already have one, 7-10 days post-operatively.  IF YOU HAVE ANY QUESTIONS, PLEASE FEEL FREE TO CALL OUR OFFICE.  HELPFUL INFORMATION  You should wean off your narcotic medicines as soon as you are able.  Most patients will be off or using minimal narcotics before their first postop appointment.   You may be more comfortable sleeping in a semi-seated position the first few nights following surgery.  Keep a pillow propped under the elbow and forearm for comfort.  If you have a recliner type of chair it might be beneficial.    We suggest you use the pain medication the first night prior to going to bed, in order to ease any pain when the anesthesia wears off. You should avoid taking pain medications on an empty stomach as it will make you nauseous.  Do not drink alcoholic beverages or take illicit drugs when taking pain medications.  You may return to work/school in the next couple of days when you feel up to it. Desk work and typing is fine.  Pain medication may make you constipated.  Below are a few solutions to try in this order: Decrease the amount of pain medication if you aren't having pain. Drink lots of decaffeinated fluids. Drink prune juice  and/or each dried prunes  If the first 3 don't work start with additional solutions Take Colace - an over-the-counter stool softener Take Senokot - an over-the-counter laxative Take Miralax - a stronger over-the-counter laxative

## 2020-06-18 NOTE — Anesthesia Preprocedure Evaluation (Signed)
Anesthesia Evaluation  Patient identified by MRN, date of birth, ID band Patient awake    Reviewed: Allergy & Precautions, NPO status , Patient's Chart, lab work & pertinent test results  History of Anesthesia Complications Negative for: history of anesthetic complications  Airway Mallampati: II  TM Distance: >3 FB Neck ROM: Full    Dental  (+) Edentulous Lower, Edentulous Upper   Pulmonary pneumonia, former smoker,    Pulmonary exam normal breath sounds clear to auscultation       Cardiovascular Exercise Tolerance: Good hypertension, Pt. on medications Normal cardiovascular exam Rhythm:Regular Rate:Normal     Neuro/Psych    GI/Hepatic negative GI ROS, Neg liver ROS,   Endo/Other  diabetes, Well Controlled, Type 2, Oral Hypoglycemic Agents  Renal/GU negative Renal ROS  negative genitourinary   Musculoskeletal negative musculoskeletal ROS (+)   Abdominal   Peds  Hematology negative hematology ROS (+)   Anesthesia Other Findings   Reproductive/Obstetrics negative OB ROS                             Anesthesia Physical Anesthesia Plan  ASA: II  Anesthesia Plan: Bier Block and MAC and Bier Block-LIDOCAINE ONLY   Post-op Pain Management:    Induction: Intravenous  PONV Risk Score and Plan: TIVA  Airway Management Planned: Nasal Cannula and Natural Airway  Additional Equipment:   Intra-op Plan:   Post-operative Plan:   Informed Consent: I have reviewed the patients History and Physical, chart, labs and discussed the procedure including the risks, benefits and alternatives for the proposed anesthesia with the patient or authorized representative who has indicated his/her understanding and acceptance.     Dental advisory given  Plan Discussed with: CRNA and Surgeon  Anesthesia Plan Comments:         Anesthesia Quick Evaluation

## 2020-06-18 NOTE — Op Note (Signed)
Orthopaedic Surgery Operative Note (CSN: [id number])  Annette Washington  [birth year] Date of Surgery: 06/18/2020   Diagnoses:  Right carpal tunnel syndrome  Procedure: Right Open Carpal Tunnel Release   Operative Finding Successful completion of the planned procedure.  No complications.  Median nerve visualized and was noted to be irritated and constricted by several overlying tight bands, including the transverse carpal ligament.   Post-Op Diagnosis: Same Surgeons:Primary: [name], MD Assistants:  Location: [facility] ROOM 4 Anesthesia: Sedation plus regional anesthesia Antibiotics: Ancef 2 g Tourniquet time:  Total Tourniquet Time Documented: Upper Arm (Right) - 50 minutes Total: Upper Arm (Right) - 50 minutes  Estimated Blood Loss: Minimal Complications: None Specimens: None Implants: * No implants in log *  Indications for Surgery:   Annette Washington is a 64 y.o. female with numbness, tingling and pain in the median nerve distribution.  EMG confirmed moderate to severe carpal tunnel syndrome.  Benefits and risks of operative and nonoperative management were discussed prior to surgery with patient/guardian(s) and informed consent form was completed.  Specific risks including infection, need for additional surgery, persistent pain and numbness, recurrence of median nerve compression, bleeding, damage to surrounding structures and more severe complications associated with anesthesia.    Procedure:   The patient was identified properly. Informed consent was obtained and the surgical site was marked. The patient was taken up to suite where general anesthesia was induced.  The patient was positioned supine with her arm on a hand table.  The right arm was prepped and draped in the usual sterile fashion.  Timeout was performed before the beginning of the case.  Tourniquet was used for the above duration, which included regional anesthesia with a Bier block.  Incision was made  in line with the radial border of the ring finger. The carpal tunnel transverse fascia was identified, cleaned, and incised sharply. The common sensory branches were visualized along with the superficial palmar arch and protected.  The median nerve was protected below. Deep retractors were placed underneath the transverse carpal ligament, protecting the nerve. I released the ligament completely, and then released the proximal distal volar forearm fascia. The nerve was identified, and visualized, and protected throughout the case. No masses or abnormalities were identified in ulnar bursa.  The wounds were irrigated copiously, and the wounds injected, and the skin closed with interrupted nylon sutures followed by sterile gauze. Patient  tolerated this well, with no complications.   Post-operative plan:  The patient will be discharged home from the PACU. WBAT on the operative extremity; limit lifting to nothing more than a coffee cup until follow up appointment   DVT prophylaxis not indicated in this ambulatory upper extremity patient without significant risk factors.    Pain control with PRN pain medication preferring oral medicines.   Follow up plan will be scheduled in approximately 7-10 days for incision check

## 2020-06-19 ENCOUNTER — Encounter (HOSPITAL_COMMUNITY): Payer: Self-pay | Admitting: Orthopedic Surgery

## 2020-06-19 NOTE — Progress Notes (Signed)
See approval.

## 2020-06-27 ENCOUNTER — Other Ambulatory Visit: Payer: Self-pay

## 2020-06-27 ENCOUNTER — Encounter: Payer: Self-pay | Admitting: Orthopedic Surgery

## 2020-06-27 ENCOUNTER — Ambulatory Visit (INDEPENDENT_AMBULATORY_CARE_PROVIDER_SITE_OTHER): Payer: Medicare HMO | Admitting: Orthopedic Surgery

## 2020-06-27 VITALS — BP 166/78 | HR 75 | Ht 61.0 in | Wt 202.0 lb

## 2020-06-27 DIAGNOSIS — G5601 Carpal tunnel syndrome, right upper limb: Secondary | ICD-10-CM

## 2020-06-27 NOTE — Progress Notes (Signed)
Orthopaedic Postop Note  Assessment: Annette Washington is a 64 y.o. female s/p R carpal tunnel release  DOS: 06/18/2020  Plan: Sutures removed, steri strips placed Do not submerge incision, ok to clean hands.  Do not scrub.  Pat dry. Gradually increase activity with operative hand.  Can continue to have improvement in symptoms for up to 12 months.  Follow up as needed   Follow-up: Return if symptoms worsen or fail to improve. XR at next visit: None  Subjective:  Chief Complaint  Patient presents with  . Routine Post Op    Rt CTR DOS 06/18/20    History of Present Illness: Annette Washington is a 64 y.o. female who presents following the above stated procedure.  She is doing well.  She did not require much pain medicine.  Her preoperative symptoms are much better.  She does continue to have some residual numbness in her long and ring finger, but this has also improved.  She is pleased with the results thus far.   Review of Systems: No fevers or chills No numbness or tingling No Chest Pain No shortness of breath   Objective: BP (!) 166/78   Pulse 75   Ht 5\' 1"  (1.549 m)   Wt 202 lb (91.6 kg)   BMI 38.17 kg/m   Physical Exam:  Surgical incision is healing well.  No surrounding erythema or drainage.  No swelling of the right hand or wrist. Decreased sensation to the long and ring fingers.  Intact sensation to the index finger and thumb   IMAGING: I personally ordered and reviewed the following images:  No new imaging obtained today.  , MD 06/27/2020 9:26 AM

## 2020-10-16 ENCOUNTER — Ambulatory Visit (INDEPENDENT_AMBULATORY_CARE_PROVIDER_SITE_OTHER): Payer: Medicare HMO | Admitting: Obstetrics & Gynecology

## 2020-10-16 ENCOUNTER — Encounter: Payer: Self-pay | Admitting: Obstetrics & Gynecology

## 2020-10-16 ENCOUNTER — Other Ambulatory Visit (HOSPITAL_COMMUNITY)
Admission: RE | Admit: 2020-10-16 | Discharge: 2020-10-16 | Disposition: A | Discharge status: Home / Self Care | Payer: Medicare HMO | Source: Ambulatory Visit | Attending: Obstetrics & Gynecology | Admitting: Obstetrics & Gynecology

## 2020-10-16 ENCOUNTER — Other Ambulatory Visit: Payer: Self-pay

## 2020-10-16 VITALS — BP 156/82 | HR 85 | Ht 60.5 in | Wt 208.0 lb

## 2020-10-16 DIAGNOSIS — Z124 Encounter for screening for malignant neoplasm of cervix: Secondary | ICD-10-CM

## 2020-10-16 DIAGNOSIS — Z1211 Encounter for screening for malignant neoplasm of colon: Secondary | ICD-10-CM

## 2020-10-16 DIAGNOSIS — Z1212 Encounter for screening for malignant neoplasm of rectum: Secondary | ICD-10-CM

## 2020-10-16 DIAGNOSIS — Z1151 Encounter for screening for human papillomavirus (HPV): Secondary | ICD-10-CM | POA: Diagnosis not present

## 2020-10-16 LAB — HEMOCCULT GUIAC POC 1CARD (OFFICE): Fecal Occult Blood, POC: NEGATIVE

## 2020-10-16 NOTE — Progress Notes (Signed)
Subjective:     Annette Washington is a 65 y.o. female here for a routine exam.  No LMP recorded. Patient is postmenopausal. [id number] Birth Control Method:  menopausal Menstrual Calendar(currently): amenorrheic  Current complaints: none.   Current acute medical issues:  none   Recent Gynecologic History No LMP recorded. Patient is postmenopausal. Last Pap: 2019,  normal Last mammogram: 7/21,  normal  Past Medical History:  Diagnosis Date  . Diabetes mellitus without complication ([place])   . Hypertension   . Pneumonia     Past Surgical History:  Procedure Laterality Date  . CARPAL TUNNEL RELEASE Right 06/18/2020   Procedure: CARPAL TUNNEL RELEASE;  Surgeon: [name], MD;  Location: [facility];  Service: Orthopedics;  Laterality: Right;  . CATARACT EXTRACTION W/PHACO Left 04/12/2018   Procedure: CATARACT EXTRACTION PHACO AND INTRAOCULAR LENS PLACEMENT (IOC);  Surgeon: Tonny Branch, MD;  Location: [facility];  Service: Ophthalmology;  Laterality: Left;  CDE:  7.79  . CATARACT EXTRACTION W/PHACO Right 04/26/2018   Procedure: CATARACT EXTRACTION PHACO AND INTRAOCULAR LENS PLACEMENT RIGHT EYE CDE=6.51;  Surgeon: Tonny Branch, MD;  Location: [facility];  Service: Ophthalmology;  Laterality: Right;  right  . COLONOSCOPY N/A 02/12/2017   Procedure: COLONOSCOPY;  Surgeon: Rogene Houston, MD;  [facility];  Service: Endoscopy;  Laterality: N/A;  730  . COLONOSCOPY WITH PROPOFOL N/A 04/18/2020   Procedure: COLONOSCOPY WITH PROPOFOL;  Surgeon: Rogene Houston, MD;  [facility];  Service: Endoscopy;  Laterality: N/A;  830  . TONSILLECTOMY      OB History    Gravida  3   Para  3   Term  3   Preterm      AB      Living  2     SAB      IAB      Ectopic      Multiple      Live Births  2           Social History   Socioeconomic History  . Marital status: Married    Spouse name: Not on file  . Number of children: Not on file  . Years of education: Not  on file  . Highest education level: Not on file  Occupational History  . Not on file  Tobacco Use  . Smoking status: Former Smoker    Types: Cigarettes  . Smokeless tobacco: Never Used  Vaping Use  . Vaping Use: Never used  Substance and Sexual Activity  . Alcohol use: No  . Drug use: No  . Sexual activity: Yes    Birth control/protection: Post-menopausal  Other Topics Concern  . Not on file  Social History Narrative  . Not on file   Social Determinants of Health   Financial Resource Strain: Low Risk   . Difficulty of Paying Living Expenses: Not hard at all  Food Insecurity: No Food Insecurity  . Worried About Charity fundraiser in the Last Year: Never true  . Ran Out of Food in the Last Year: Never true  Transportation Needs: No Transportation Needs  . Lack of Transportation (Medical): No  . Lack of Transportation (Non-Medical): No  Physical Activity: Inactive  . Days of Exercise per Week: 0 days  . Minutes of Exercise per Session: 0 min  Stress: No Stress Concern Present  . Feeling of Stress : Not at all  Social Connections: Moderately Integrated  . Frequency of Communication with Friends and  Family: More than three times a week  . Frequency of Social Gatherings with Friends and Family: Once a week  . Attends Religious Services: More than 4 times per year  . Active Member of Clubs or Organizations: No  . Attends Archivist Meetings: Never  . Marital Status: Married    Family History  Problem Relation Age of Onset  . Kidney disease Mother   . Breast cancer Maternal Grandmother      Current Outpatient Medications:  .  amLODipine (NORVASC) 5 MG tablet, Take 5 mg by mouth daily., Disp: , Rfl:  .  azelastine (ASTELIN) 0.1 % nasal spray, Place 1 spray into both nostrils daily as needed for allergies., Disp: , Rfl:  .  cetirizine (ZYRTEC) 10 MG tablet, Take 10 mg by mouth daily as needed for allergies., Disp: , Rfl:  .  cholecalciferol (VITAMIN D3) 25 MCG  (1000 UNIT) tablet, Take 1,000 Units by mouth daily., Disp: , Rfl:  .  ELDERBERRY PO, Take 1 capsule by mouth daily., Disp: , Rfl:  .  fluticasone (FLONASE) 50 MCG/ACT nasal spray, Place 2 sprays into both nostrils daily as needed for allergies or rhinitis., Disp: , Rfl:  .  ibuprofen (ADVIL) 200 MG tablet, Take 400 mg by mouth every 6 (six) hours as needed for mild pain or headache., Disp: , Rfl:  .  losartan-hydrochlorothiazide (HYZAAR) 50-12.5 MG tablet, Take 1 tablet by mouth daily., Disp: , Rfl:  .  metFORMIN (GLUCOPHAGE) 500 MG tablet, Take 500 mg by mouth at bedtime. , Disp: , Rfl:  .  pravastatin (PRAVACHOL) 10 MG tablet, Take 10 mg by mouth daily., Disp: , Rfl:   Review of Systems  Review of Systems  Constitutional: Negative for fever, chills, weight loss, malaise/fatigue and diaphoresis.  HENT: Negative for hearing loss, ear pain, nosebleeds, congestion, sore throat, neck pain, tinnitus and ear discharge.   Eyes: Negative for blurred vision, double vision, photophobia, pain, discharge and redness.  Respiratory: Negative for cough, hemoptysis, sputum production, shortness of breath, wheezing and stridor.   Cardiovascular: Negative for chest pain, palpitations, orthopnea, claudication, leg swelling and PND.  Gastrointestinal: negative for abdominal pain. Negative for heartburn, nausea, vomiting, diarrhea, constipation, blood in stool and melena.  Genitourinary: Negative for dysuria, urgency, frequency, hematuria and flank pain.  Musculoskeletal: Negative for myalgias, back pain, joint pain and falls.  Skin: Negative for itching and rash.  Neurological: Negative for dizziness, tingling, tremors, sensory change, speech change, focal weakness, seizures, loss of consciousness, weakness and headaches.  Endo/Heme/Allergies: Negative for environmental allergies and polydipsia. Does not bruise/bleed easily.  Psychiatric/Behavioral: Negative for depression, suicidal ideas, hallucinations, memory  loss and substance abuse. The patient is not nervous/anxious and does not have insomnia.        Objective:  Blood pressure (!) 156/82, pulse 85, height 5' 0.5" (1.537 m), weight 208 lb (94.3 kg).   Physical Exam  Vitals reviewed. Constitutional: She is oriented to person, place, and time. She appears well-developed and well-nourished.  HENT:  Head: Normocephalic and atraumatic.        Right Ear: External ear normal.  Left Ear: External ear normal.  Nose: Nose normal.  Mouth/Throat: Oropharynx is clear and moist.  Eyes: Conjunctivae and EOM are normal. Pupils are equal, round, and reactive to light. Right eye exhibits no discharge. Left eye exhibits no discharge. No scleral icterus.  Neck: Normal range of motion. Neck supple. No tracheal deviation present. No thyromegaly present.  Cardiovascular: Normal rate, regular rhythm, normal  heart sounds and intact distal pulses.  Exam reveals no gallop and no friction rub.   No murmur heard. Respiratory: Effort normal and breath sounds normal. No respiratory distress. She has no wheezes. She has no rales. She exhibits no tenderness.  GI: Soft. Bowel sounds are normal. She exhibits no distension and no mass. There is no tenderness. There is no rebound and no guarding.  Genitourinary:  Breasts no masses skin changes or nipple changes bilaterally      Vulva is normal without lesions Vagina is pink moist without discharge Cervix normal in appearance and pap is done Uterus is normal size shape and contour Adnexa is negative with normal sized ovaries  {Rectal    hemoccult negative, normal tone, no masses  Musculoskeletal: Normal range of motion. She exhibits no edema and no tenderness.  Neurological: She is alert and oriented to person, place, and time. She has normal reflexes. She displays normal reflexes. No cranial nerve deficit. She exhibits normal muscle tone. Coordination normal.  Skin: Skin is warm and dry. No rash noted. No erythema. No  pallor.  Psychiatric: She has a normal mood and affect. Her behavior is normal. Judgment and thought content normal.       Medications Ordered at today's visit: No orders of the defined types were placed in this encounter.   Other orders placed at today's visit: Orders Placed This Encounter  Procedures  . POCT occult blood stool      Assessment:    Normal Gyn exam.    Plan:    Follow up in: 3 years.     No follow-ups on file.

## 2020-10-19 LAB — CYTOLOGY - PAP
Comment: NEGATIVE
Diagnosis: NEGATIVE
High risk HPV: NEGATIVE

## 2020-11-10 ENCOUNTER — Other Ambulatory Visit: Payer: Self-pay

## 2020-11-10 ENCOUNTER — Encounter: Payer: Self-pay | Admitting: Emergency Medicine

## 2020-11-10 ENCOUNTER — Ambulatory Visit
Admission: EM | Admit: 2020-11-10 | Discharge: 2020-11-10 | Disposition: A | Discharge status: Home / Self Care | Payer: Medicare HMO | Attending: Family Medicine | Admitting: Family Medicine

## 2020-11-10 DIAGNOSIS — M5431 Sciatica, right side: Secondary | ICD-10-CM | POA: Diagnosis not present

## 2020-11-10 DIAGNOSIS — M25551 Pain in right hip: Secondary | ICD-10-CM

## 2020-11-10 MED ORDER — TIZANIDINE HCL 4 MG PO TABS: 4.0000 mg | ORAL_TABLET | Freq: Four times a day (QID) | ORAL | 0 refills | Status: DC | PRN

## 2020-11-10 MED ORDER — PREDNISONE 20 MG PO TABS: 40.0000 mg | ORAL_TABLET | Freq: Every day | ORAL | 0 refills | Status: DC

## 2020-11-10 MED ORDER — PREDNISONE 20 MG PO TABS: 40.0000 mg | ORAL_TABLET | Freq: Every day | ORAL | 0 refills | Status: AC

## 2020-11-10 NOTE — ED Triage Notes (Signed)
C/o pain on right side that radiates down to upper leg.  patient describes pain as tender.  Pain x 2 weeks.  States she does not feel the pain when she is sitting or lying down.

## 2020-11-10 NOTE — Discharge Instructions (Addendum)
Sent in prednisone for you to take 2 tablets once a day in the morning with breakfast for 5 days  We have also sent in testing again for you to take 1 tablet every 6 hours as needed for muscle spasms.  Do not drive or operate heavy machinery while taking this medication secondary to sleepy.  Follow up with this office or with primary care if symptoms are persisting.  Follow up in the ER for high fever, trouble swallowing, trouble breathing, other concerning symptoms.

## 2020-11-10 NOTE — ED Provider Notes (Signed)
[facility]    CSN: [id number] Arrival date & time: 11/10/20  1101      History   Chief Complaint No chief complaint on file.   HPI Annette Washington is a 65 y.o. female.   Reports right-sided low back pain that radiates down to the upper thigh on the right leg.  Reports that the pain is tender, and has been present intermittently for the last 2 weeks.  States that she does not feel this when she is sitting or lying down, just changing positions and standing.  Denies previous symptoms.  Has been taking Motrin at home with some temporary relief.  Denies numbness, tingling, loss of bowel or bladder control, severe pain.  ROS per HPI  The history is provided by the patient.    Past Medical History:  Diagnosis Date  . Diabetes mellitus without complication ([place])   . Hypertension   . Pneumonia     Patient Active Problem List   Diagnosis Date Noted  . Special screening for malignant neoplasms, colon 10/10/2016  . Hypertension 12/23/2013    Past Surgical History:  Procedure Laterality Date  . CARPAL TUNNEL RELEASE Right 06/18/2020   Procedure: CARPAL TUNNEL RELEASE;  Surgeon: [name], MD;  Location: [facility];  Service: Orthopedics;  Laterality: Right;  . CATARACT EXTRACTION W/PHACO Left 04/12/2018   Procedure: CATARACT EXTRACTION PHACO AND INTRAOCULAR LENS PLACEMENT (IOC);  Surgeon: Tonny Branch, MD;  Location: [facility];  Service: Ophthalmology;  Laterality: Left;  CDE:  7.79  . CATARACT EXTRACTION W/PHACO Right 04/26/2018   Procedure: CATARACT EXTRACTION PHACO AND INTRAOCULAR LENS PLACEMENT RIGHT EYE CDE=6.51;  Surgeon: Tonny Branch, MD;  Location: [facility];  Service: Ophthalmology;  Laterality: Right;  right  . COLONOSCOPY N/A 02/12/2017   Procedure: COLONOSCOPY;  Surgeon: Rogene Houston, MD;  [facility]: [facility];  Service: Endoscopy;  Laterality: N/A;  730  . COLONOSCOPY WITH PROPOFOL N/A 04/18/2020   Procedure: COLONOSCOPY WITH PROPOFOL;  Surgeon: Rogene Houston, MD;  [facility]: [facility];  Service: Endoscopy;  Laterality: N/A;  830  . TONSILLECTOMY      OB History    Gravida  3   Para  3   Term  3   Preterm      AB      Living  2     SAB      IAB      Ectopic      Multiple      Live Births  2            Home Medications    Prior to Admission medications   Medication Sig Start Date End Date Taking? Authorizing Provider  amLODipine (NORVASC) 5 MG tablet Take 5 mg by mouth daily.    [provider]  azelastine (ASTELIN) 0.1 % nasal spray Place 1 spray into both nostrils daily as needed for allergies. 05/31/20   [provider]  cetirizine (ZYRTEC) 10 MG tablet Take 10 mg by mouth daily as needed for allergies.    [provider]  cholecalciferol (VITAMIN D3) 25 MCG (1000 UNIT) tablet Take 1,000 Units by mouth daily.    [provider]  ELDERBERRY PO Take 1 capsule by mouth daily.    [provider]  fluticasone (FLONASE) 50 MCG/ACT nasal spray Place 2 sprays into both nostrils daily as needed for allergies or rhinitis.    [provider]  ibuprofen (ADVIL) 200 MG tablet Take 400 mg  by mouth every 6 (six) hours as needed for mild pain or headache.    [provider]  losartan-hydrochlorothiazide (HYZAAR) 50-12.5 MG tablet Take 1 tablet by mouth daily.    [provider]  metFORMIN (GLUCOPHAGE) 500 MG tablet Take 500 mg by mouth at bedtime.     [provider]  pravastatin (PRAVACHOL) 10 MG tablet Take 10 mg by mouth daily.    [provider]  predniSONE (DELTASONE) 20 MG tablet Take 2 tablets (40 mg total) by mouth daily with breakfast for 5 days. 11/10/20 11/15/20  Faustino Congress, NP  tiZANidine (ZANAFLEX) 4 MG tablet Take 1 tablet (4 mg total) by mouth every 6 (six) hours as needed for muscle spasms. 11/10/20   Faustino Congress, NP    Family History Family History  Problem Relation Age of Onset  . Kidney disease  Mother   . Breast cancer Maternal Grandmother     Social History Social History   Tobacco Use  . Smoking status: Former Smoker    Types: Cigarettes  . Smokeless tobacco: Never Used  Vaping Use  . Vaping Use: Never used  Substance Use Topics  . Alcohol use: No  . Drug use: No     Allergies   Tylenol [acetaminophen]   Review of Systems Review of Systems   Physical Exam Triage Vital Signs ED Triage Vitals  Enc Vitals Group     BP 11/10/20 1106 (!) 154/89     Pulse Rate 11/10/20 1106 (!) 107     Resp 11/10/20 1106 17     Temp 11/10/20 1106 (!) 97.5 F (36.4 C)     Temp Source 11/10/20 1106 Tympanic     SpO2 11/10/20 1106 97 %     Weight --      Height --      Head Circumference --      Peak Flow --      Pain Score 11/10/20 1111 3     Pain Loc --      Pain Edu? --      Excl. in [place]? --    No data found.  Updated Vital Signs BP (!) 154/89 (BP Location: Right Arm)   Pulse (!) 107   Temp (!) 97.5 F (36.4 C) (Tympanic)   Resp 17   SpO2 97%       Physical Exam Vitals and nursing note reviewed.  Constitutional:      General: She is not in acute distress.    Appearance: Normal appearance. She is well-developed. She is not ill-appearing.  HENT:     Head: Normocephalic and atraumatic.     Nose: Nose normal.     Mouth/Throat:     Mouth: Mucous membranes are moist.     Pharynx: Oropharynx is clear.  Eyes:     Extraocular Movements: Extraocular movements intact.     Conjunctiva/sclera: Conjunctivae normal.     Pupils: Pupils are equal, round, and reactive to light.  Cardiovascular:     Rate and Rhythm: Normal rate and regular rhythm.  Pulmonary:     Effort: Pulmonary effort is normal.  Musculoskeletal:        General: Tenderness present.     Cervical back: Normal range of motion and neck supple.     Comments: Right low back, right hip, right  Skin:    General: Skin is warm and dry.  Neurological:     General: No focal deficit present.     Mental  Status: She is  alert and oriented to person, place, and time.  Psychiatric:        Mood and Affect: Mood normal.        Behavior: Behavior normal.        Thought Content: Thought content normal.      UC Treatments / Results  Labs (all labs ordered are listed, but only abnormal results are displayed) Labs Reviewed - No data to display  EKG   Radiology No results found.  Procedures Procedures (including critical care time)  Medications Ordered in UC Medications - No data to display  Initial Impression / Assessment and Plan / UC Course  I have reviewed the triage vital signs and the nursing notes.  Pertinent labs & imaging results that were available during my care of the patient were reviewed by me and considered in my medical decision making (see chart for details).    Right hip pain Right-sided sciatica  Prescribed prednisone 40 mg daily x5 days Prescribed tizanidine Sedation precautions given May continue ibuprofen as needed May use ice or heat to the area, topical rubs Follow-up with PCP as needed  Final Clinical Impressions(s) / UC Diagnoses   Final diagnoses:  Right hip pain  Right sided sciatica     Discharge Instructions     Sent in prednisone for you to take 2 tablets once a day in the morning with breakfast for 5 days  We have also sent in testing again for you to take 1 tablet every 6 hours as needed for muscle spasms.  Do not drive or operate heavy machinery while taking this medication secondary to sleepy.  Follow up with this office or with primary care if symptoms are persisting.  Follow up in the ER for high fever, trouble swallowing, trouble breathing, other concerning symptoms.     ED Prescriptions    Medication Sig Dispense Auth. Provider   predniSONE (DELTASONE) 20 MG tablet  (Status: Discontinued) Take 2 tablets (40 mg total) by mouth daily with breakfast for 5 days. 10 tablet Faustino Congress, NP   tiZANidine (ZANAFLEX) 4 MG  tablet  (Status: Discontinued) Take 1 tablet (4 mg total) by mouth every 6 (six) hours as needed for muscle spasms. 30 tablet Faustino Congress, NP   predniSONE (DELTASONE) 20 MG tablet Take 2 tablets (40 mg total) by mouth daily with breakfast for 5 days. 10 tablet Faustino Congress, NP   tiZANidine (ZANAFLEX) 4 MG tablet Take 1 tablet (4 mg total) by mouth every 6 (six) hours as needed for muscle spasms. 30 tablet Faustino Congress, NP     PDMP not reviewed this encounter.   Faustino Congress, NP 11/10/20 1153

## 2020-12-25 ENCOUNTER — Other Ambulatory Visit (HOSPITAL_COMMUNITY): Payer: Self-pay | Admitting: Emergency Medicine

## 2020-12-25 ENCOUNTER — Other Ambulatory Visit (HOSPITAL_COMMUNITY): Payer: Self-pay | Admitting: Obstetrics & Gynecology

## 2020-12-25 DIAGNOSIS — Z1231 Encounter for screening mammogram for malignant neoplasm of breast: Secondary | ICD-10-CM

## 2021-01-25 ENCOUNTER — Ambulatory Visit (HOSPITAL_COMMUNITY): Payer: Medicare HMO

## 2021-02-01 ENCOUNTER — Ambulatory Visit (HOSPITAL_COMMUNITY)
Admission: RE | Admit: 2021-02-01 | Discharge: 2021-02-01 | Disposition: A | Discharge status: Home / Self Care | Payer: Medicare HMO | Source: Ambulatory Visit | Attending: Obstetrics & Gynecology | Admitting: Obstetrics & Gynecology

## 2021-02-01 ENCOUNTER — Other Ambulatory Visit: Payer: Self-pay

## 2021-02-01 DIAGNOSIS — Z1231 Encounter for screening mammogram for malignant neoplasm of breast: Secondary | ICD-10-CM | POA: Diagnosis not present

## 2021-07-06 IMAGING — DX DG LUMBAR SPINE COMPLETE 4+V
5 series · 5 of 5 positions shown · non-contrast
Comparison: None.

CLINICAL DATA: Low back pain with lower extremity radicular
symptoms

EXAM:
LUMBAR SPINE - COMPLETE 4+ VIEW

[l-spine ap]
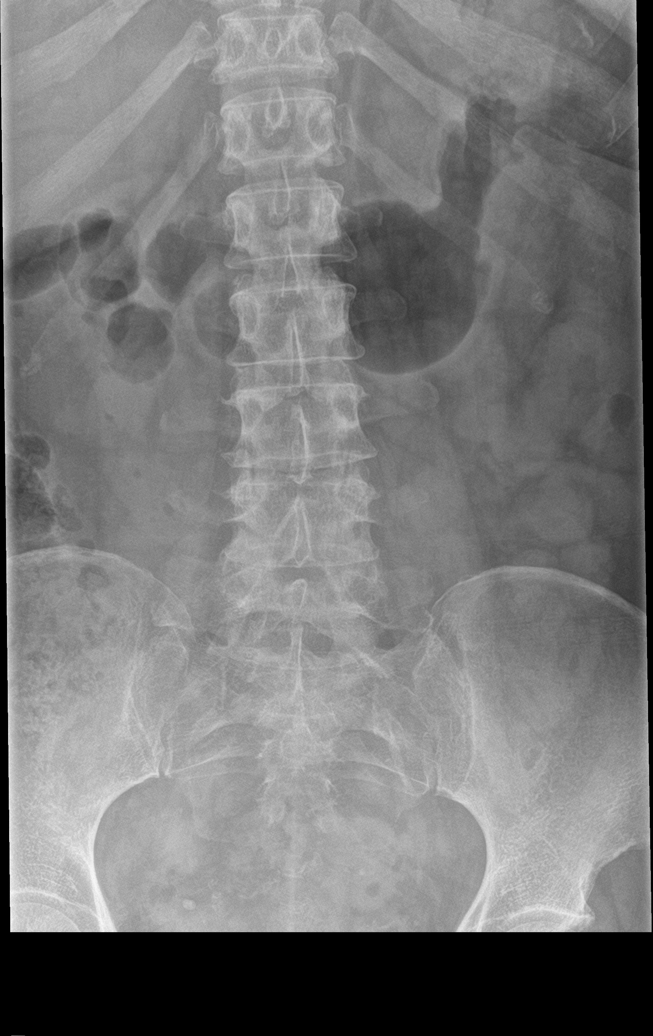

[l-spine obl (1 of 2)]
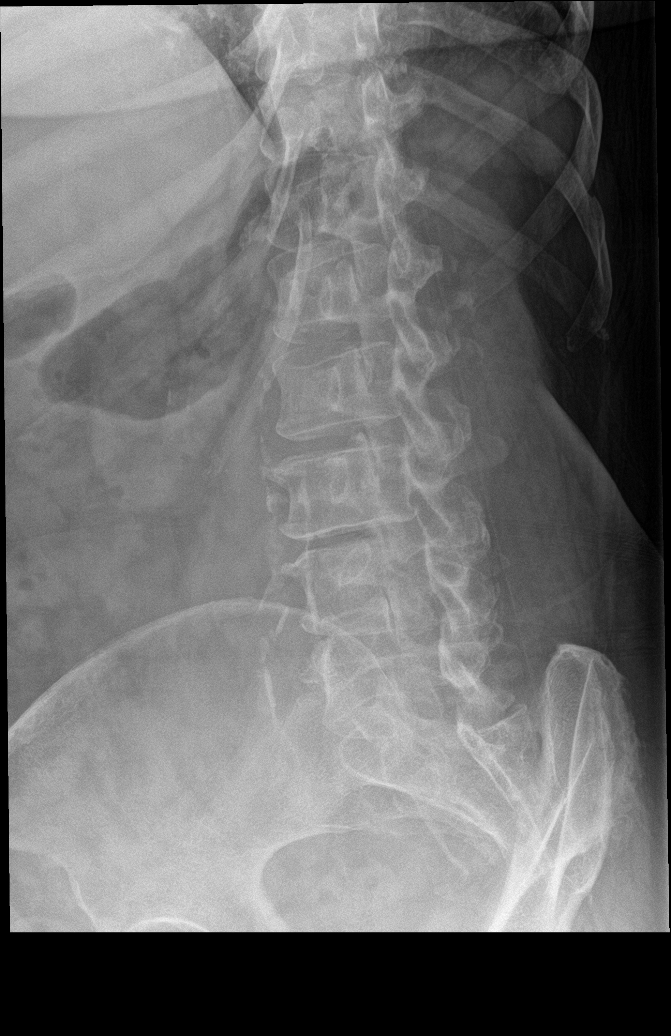

[l-spine obl (2 of 2)]
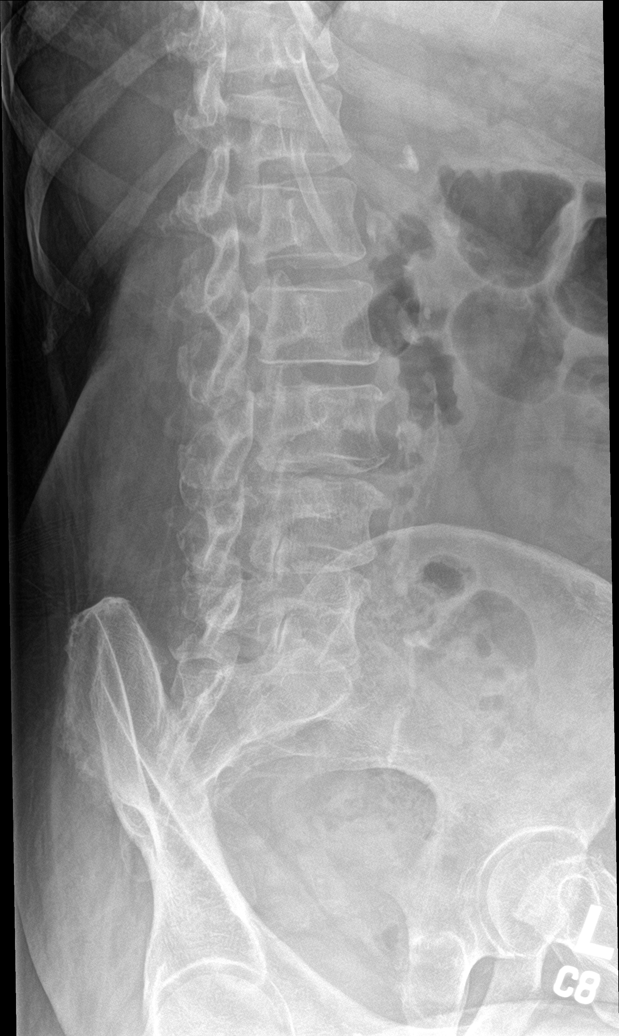

[l-spine lat]
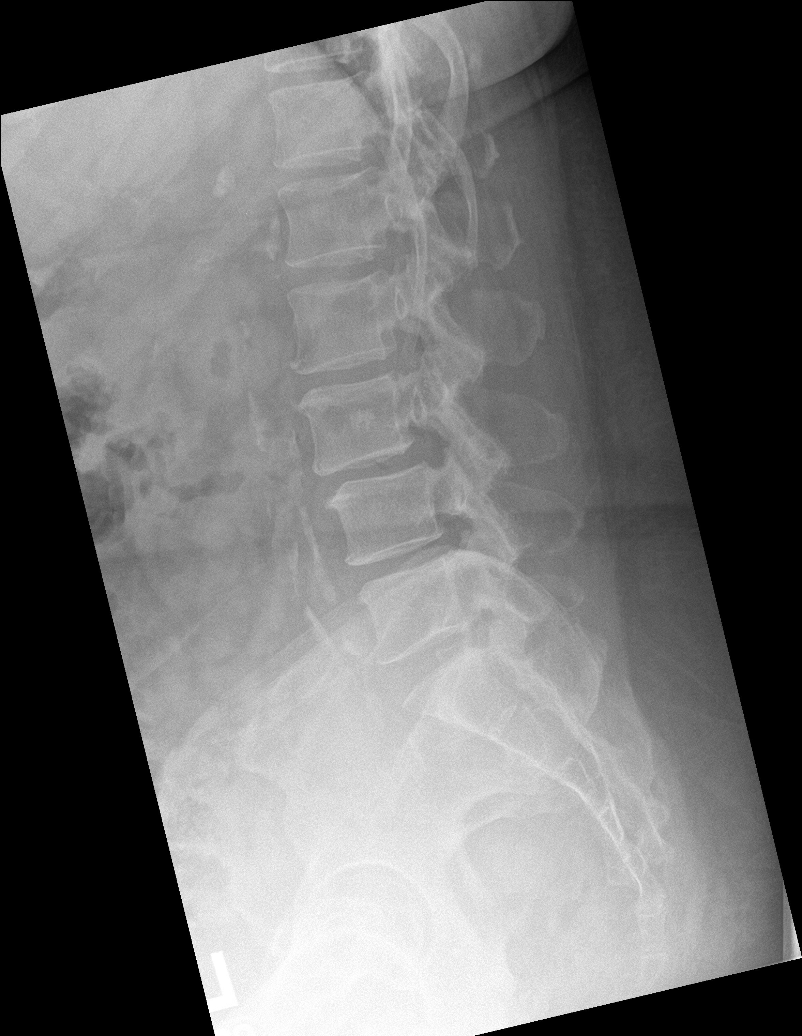

[l-spine spot]
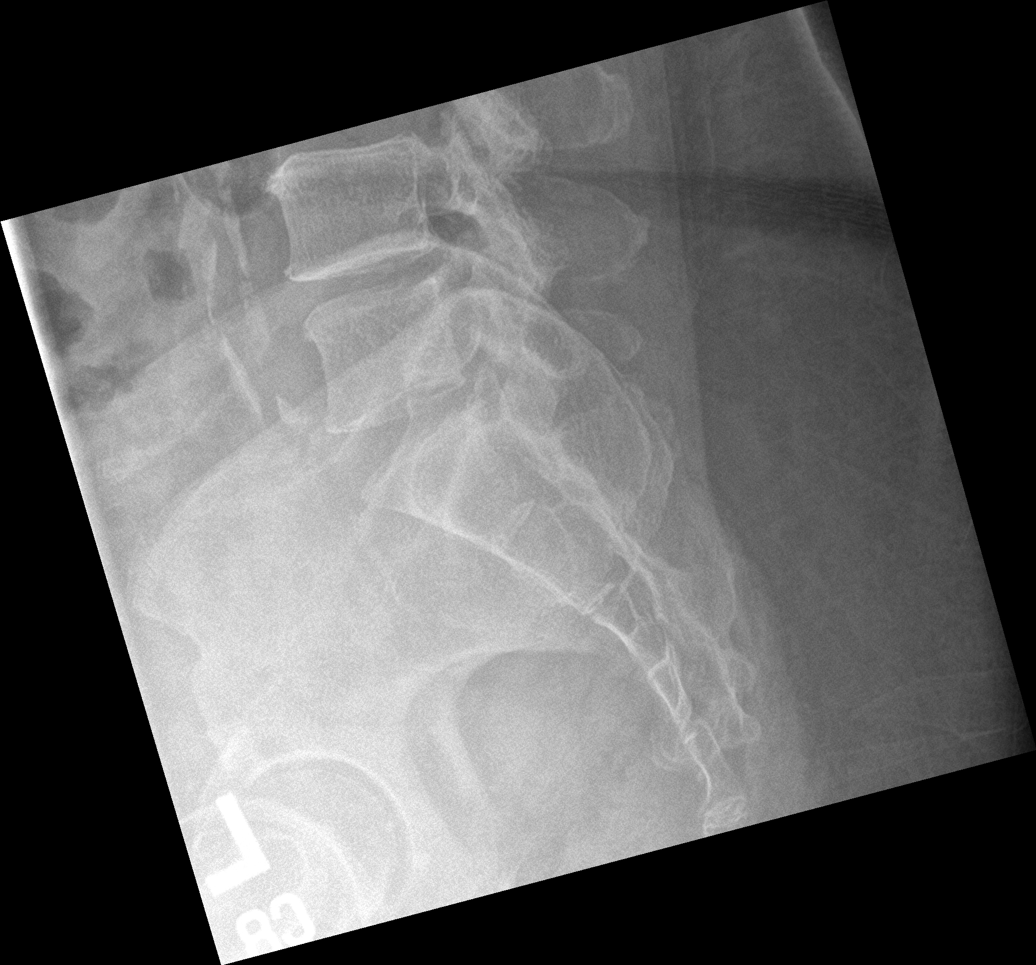

[5 of 5 positions shown; findings below may reference images not displayed]

FINDINGS: Frontal, lateral, spot lumbosacral lateral, and bilateral oblique
views were obtained. There are 4 strictly non-rib-bearing lumbar
type vertebral bodies. There are assimilation joints at L5
bilaterally. No appreciable scoliosis. No fracture or
spondylolisthesis. There is mild disc space narrowing at L2-3 and
L3-4. Other disc spaces appear unremarkable. There is facet
osteoarthritic change at L4-5 and L5-S1 bilaterally. There is aortic
atherosclerosis.
IMPRESSION: Areas of osteoarthritic change. No fracture or spondylolisthesis. No
appreciable scoliosis.

Aortic Atherosclerosis (FJH1I-NQP.P).

## 2021-07-06 IMAGING — DX DG THORACIC SPINE 3V
3 series · 3 of 3 positions shown · non-contrast
Comparison: None.

CLINICAL DATA: Dorsalgia

EXAM:
THORACIC SPINE - 3 VIEWS

[t-spine ap]
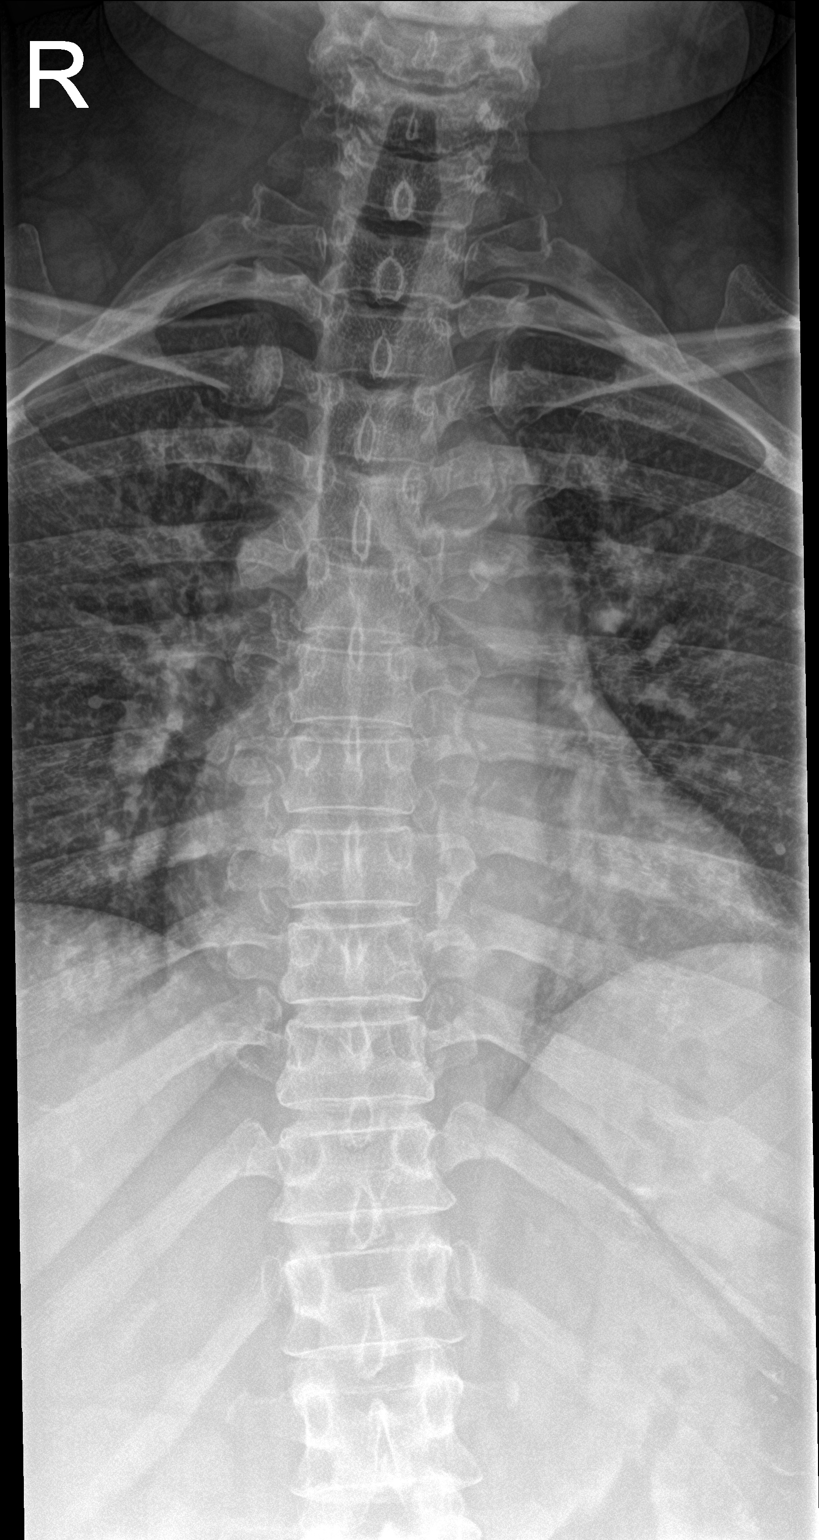

[t-spine lat]
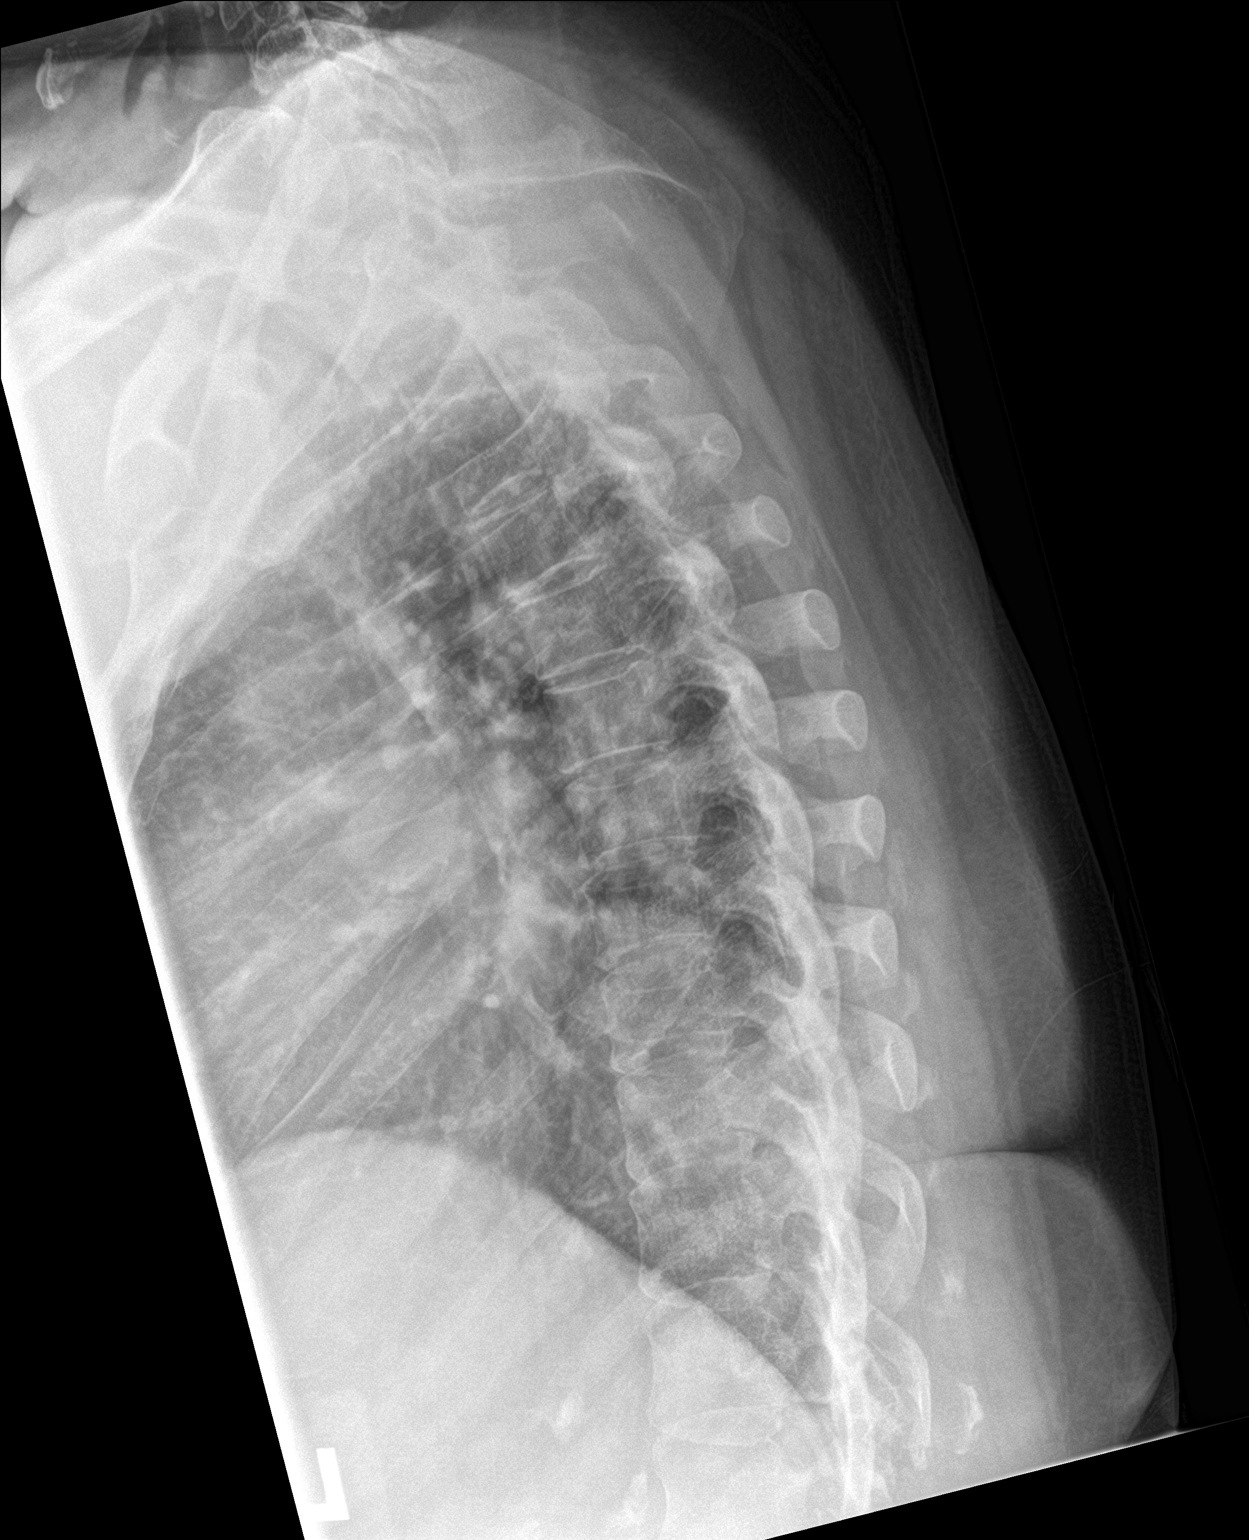

[t-spine swimmers]
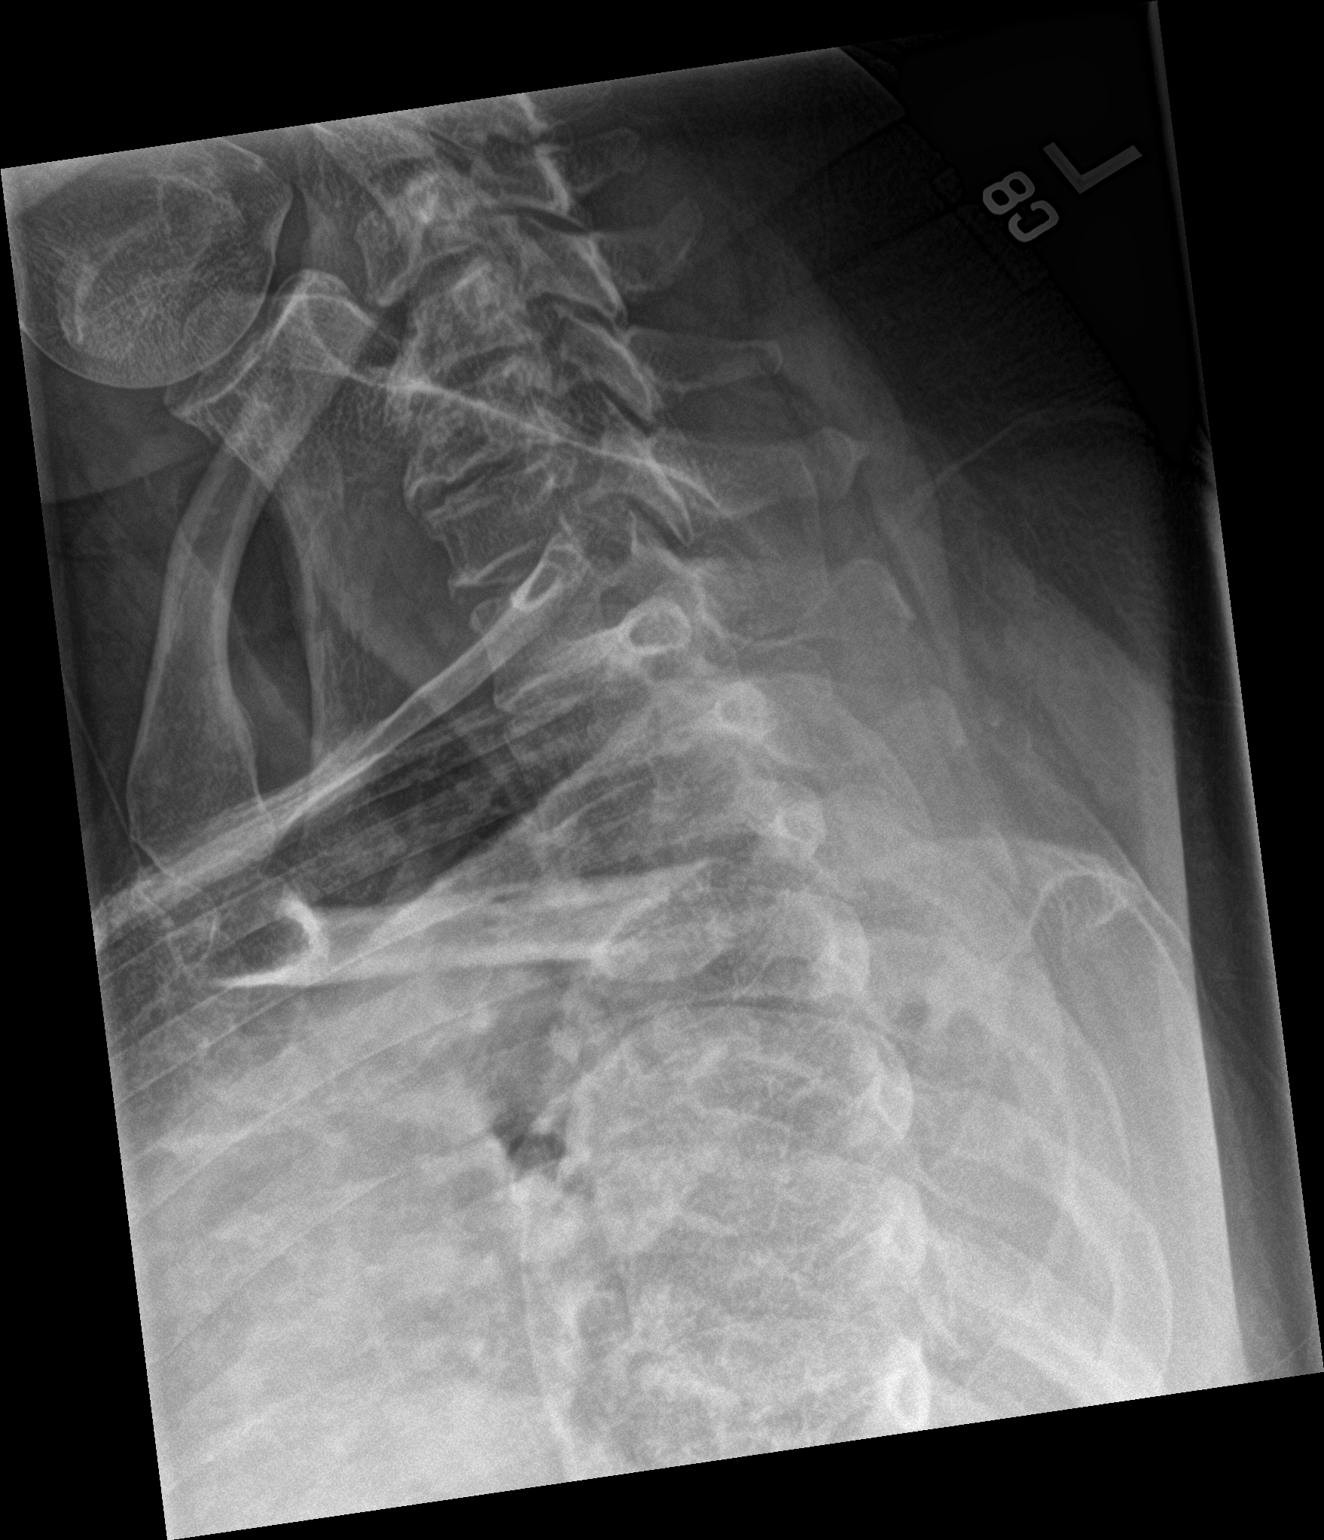

[3 of 3 positions shown; findings below may reference images not displayed]

FINDINGS: Frontal, lateral, and swimmer's views obtained. There is mild
cervicothoracic levoscoliosis. No other scoliosis. No fracture or
spondylolisthesis. There is mild disc space narrowing at several
levels in the thoracic region. No erosive change or paraspinous
lesion. Visualized lungs are clear. There is aortic atherosclerosis.
IMPRESSION: Mild cervicothoracic levoscoliosis. No scoliosis elsewhere in the
thoracic region. Mild osteoarthritic change at several levels. No
fracture or spondylolisthesis.

Aortic Atherosclerosis (03M1L-1BP.P).

## 2021-07-27 ENCOUNTER — Ambulatory Visit
Admission: EM | Admit: 2021-07-27 | Discharge: 2021-07-27 | Disposition: A | Discharge status: Home / Self Care | Payer: Medicare HMO | Attending: Family Medicine | Admitting: Family Medicine

## 2021-07-27 ENCOUNTER — Encounter: Payer: Self-pay | Admitting: *Deleted

## 2021-07-27 ENCOUNTER — Other Ambulatory Visit: Payer: Self-pay

## 2021-07-27 DIAGNOSIS — R058 Other specified cough: Secondary | ICD-10-CM | POA: Diagnosis not present

## 2021-07-27 DIAGNOSIS — R062 Wheezing: Secondary | ICD-10-CM | POA: Diagnosis not present

## 2021-07-27 MED ORDER — PREDNISONE 20 MG PO TABS: 40.0000 mg | ORAL_TABLET | Freq: Every day | ORAL | 0 refills | Status: AC

## 2021-07-27 MED ORDER — BENZONATATE 100 MG PO CAPS: ORAL_CAPSULE | ORAL | 0 refills | Status: AC

## 2021-07-27 NOTE — Discharge Instructions (Addendum)
Be aware, your blood sugars will be higher while you are taking prednisone. Monitor appropriately.

## 2021-07-27 NOTE — ED Triage Notes (Signed)
C/O cough with sinus pressure and congestion x approx 1 wk. No known fevers. Declines Covid test.

## 2021-07-29 NOTE — ED Provider Notes (Signed)
[place]   [id number] 07/27/21 Arrival Time: 1324  ASSESSMENT & PLAN:  1. Post-viral cough syndrome   2. Wheezing    No resp distress. Discussed typical duration of viral illnesses. Viral testing declined. OTC symptom care as needed.  Discharge Medication List as of 07/27/2021 10:07 AM     START taking these medications   Details  benzonatate (TESSALON) 100 MG capsule Take 1 capsule by mouth every 8 (eight) hours for cough., Normal    predniSONE (DELTASONE) 20 MG tablet Take 2 tablets (40 mg total) by mouth daily., Starting Sat 07/27/2021, Normal         Follow-up Information     Vidal Schwalbe, MD.   Specialty: Family Medicine Why: As needed. Contact information: [address] [place] [address] [phone]                 Reviewed expectations re: course of current medical issues. Questions answered. Outlined signs and symptoms indicating need for more acute intervention. Understanding verbalized. After Visit Summary given.   SUBJECTIVE: History from: Patient. Annette Washington is a 66 y.o. female. Reports: nasal congestion/pressure and cough; x 1 week. Cough is bothering her the most. Denies: fever and difficulty breathing. Normal PO intake without n/v/d.  OBJECTIVE:  Vitals:   07/27/21 0918  BP: (!) 149/75  Pulse: 79  Resp: 20  Temp: (!) 97.4 F (36.3 C)  TempSrc: Temporal  SpO2: 97%    General appearance: alert; no distress Eyes: PERRLA; EOMI; conjunctiva normal HENT: [place]; AT; with nasal congestion Neck: supple  Lungs: speaks full sentences without difficulty; unlabored; fine bilat exp wheezing present Extremities: no edema Skin: warm and dry Neurologic: normal gait Psychological: alert and cooperative; normal mood and affect   Allergies  Allergen Reactions   Tylenol [Acetaminophen] Rash    Past Medical History:  Diagnosis Date   Diabetes mellitus without complication (HCC)    Hypertension    Pneumonia     Social History   Socioeconomic History   Marital status: Married    Spouse name: Not on file   Number of children: Not on file   Years of education: Not on file   Highest education level: Not on file  Occupational History   Not on file  Tobacco Use   Smoking status: Former    Types: Cigarettes   Smokeless tobacco: Never  Vaping Use   Vaping Use: Never used  Substance and Sexual Activity   Alcohol use: No   Drug use: No   Sexual activity: Not on file  Other Topics Concern   Not on file  Social History Narrative   Not on file   Social Determinants of Health   Financial Resource Strain: Low Risk    Difficulty of Paying Living Expenses: Not hard at all  Food Insecurity: No Food Insecurity   Worried About Charity fundraiser in the Last Year: Never true   Arboriculturist in the Last Year: Never true  Transportation Needs: No Transportation Needs   Lack of Transportation (Medical): No   Lack of Transportation (Non-Medical): No  Physical Activity: Inactive   Days of Exercise per Week: 0 days   Minutes of Exercise per Session: 0 min  Stress: No Stress Concern Present   Feeling of Stress : Not at all  Social Connections: Moderately Integrated   Frequency of Communication with Friends and Family: More than three times a week   Frequency of Social Gatherings with Friends  and Family: Once a week   Attends Religious Services: More than 4 times per year   Active Member of Clubs or Organizations: No   Attends Archivist Meetings: Never   Marital Status: Married  Human resources officer Violence: Not At Risk   Fear of Current or Ex-Partner: No   Emotionally Abused: No   Physically Abused: No   Sexually Abused: No   Family History  Problem Relation Age of Onset   Kidney disease Mother    Breast cancer Maternal Grandmother    Past Surgical History:  Procedure Laterality Date   CARPAL TUNNEL RELEASE Right 06/18/2020   Procedure: CARPAL TUNNEL RELEASE;  Surgeon:  [name], MD;  Location: [facility];  Service: Orthopedics;  Laterality: Right;   CATARACT EXTRACTION W/PHACO Left 04/12/2018   Procedure: CATARACT EXTRACTION PHACO AND INTRAOCULAR LENS PLACEMENT (IOC);  Surgeon: Tonny Branch, MD;  Location: [facility];  Service: Ophthalmology;  Laterality: Left;  CDE:  7.79   CATARACT EXTRACTION W/PHACO Right 04/26/2018   Procedure: CATARACT EXTRACTION PHACO AND INTRAOCULAR LENS PLACEMENT RIGHT EYE CDE=6.51;  Surgeon: Tonny Branch, MD;  Location: [facility];  Service: Ophthalmology;  Laterality: Right;  right   COLONOSCOPY N/A 02/12/2017   Procedure: COLONOSCOPY;  Surgeon: Rogene Houston, MD;  [facility];  Service: Endoscopy;  Laterality: N/A;  730   COLONOSCOPY WITH PROPOFOL N/A 04/18/2020   Procedure: COLONOSCOPY WITH PROPOFOL;  Surgeon: Rogene Houston, MD;  [facility];  Service: Endoscopy;  Laterality: N/A;  [place]       Vanessa Kick, MD 07/29/21 1118

## 2022-01-10 ENCOUNTER — Other Ambulatory Visit (HOSPITAL_COMMUNITY): Payer: Self-pay | Admitting: Obstetrics & Gynecology

## 2022-01-10 DIAGNOSIS — Z1231 Encounter for screening mammogram for malignant neoplasm of breast: Secondary | ICD-10-CM

## 2022-02-03 ENCOUNTER — Ambulatory Visit (HOSPITAL_COMMUNITY)
Admission: RE | Admit: 2022-02-03 | Discharge: 2022-02-03 | Disposition: A | Discharge status: Home / Self Care | Payer: Medicare HMO | Source: Ambulatory Visit | Attending: Obstetrics & Gynecology | Admitting: Obstetrics & Gynecology

## 2022-02-03 DIAGNOSIS — Z1231 Encounter for screening mammogram for malignant neoplasm of breast: Secondary | ICD-10-CM | POA: Insufficient documentation

## 2022-12-26 ENCOUNTER — Other Ambulatory Visit (HOSPITAL_COMMUNITY): Payer: Self-pay | Admitting: Obstetrics & Gynecology

## 2022-12-26 DIAGNOSIS — Z1231 Encounter for screening mammogram for malignant neoplasm of breast: Secondary | ICD-10-CM

## 2023-02-03 DIAGNOSIS — Z79899 Other long term (current) drug therapy: Secondary | ICD-10-CM | POA: Diagnosis not present

## 2023-02-03 DIAGNOSIS — I10 Essential (primary) hypertension: Secondary | ICD-10-CM | POA: Diagnosis not present

## 2023-02-03 DIAGNOSIS — M7989 Other specified soft tissue disorders: Secondary | ICD-10-CM | POA: Diagnosis not present

## 2023-02-03 DIAGNOSIS — Z87891 Personal history of nicotine dependence: Secondary | ICD-10-CM | POA: Diagnosis not present

## 2023-02-06 ENCOUNTER — Ambulatory Visit (HOSPITAL_COMMUNITY): Payer: Medicare HMO

## 2023-02-12 ENCOUNTER — Ambulatory Visit (HOSPITAL_COMMUNITY)
Admission: RE | Admit: 2023-02-12 | Discharge: 2023-02-12 | Disposition: A | Discharge status: Home / Self Care | Payer: Medicare HMO | Source: Ambulatory Visit | Attending: Obstetrics & Gynecology | Admitting: Obstetrics & Gynecology

## 2023-02-12 ENCOUNTER — Encounter (HOSPITAL_COMMUNITY): Payer: Self-pay

## 2023-02-12 DIAGNOSIS — Z1231 Encounter for screening mammogram for malignant neoplasm of breast: Secondary | ICD-10-CM | POA: Diagnosis not present

## 2023-02-18 DIAGNOSIS — I10 Essential (primary) hypertension: Secondary | ICD-10-CM | POA: Diagnosis not present

## 2023-02-18 DIAGNOSIS — Z6841 Body Mass Index (BMI) 40.0 and over, adult: Secondary | ICD-10-CM | POA: Diagnosis not present

## 2023-04-04 ENCOUNTER — Encounter: Payer: Self-pay | Admitting: Emergency Medicine

## 2023-04-04 ENCOUNTER — Other Ambulatory Visit: Payer: Self-pay

## 2023-04-04 ENCOUNTER — Ambulatory Visit
Admission: EM | Admit: 2023-04-04 | Discharge: 2023-04-04 | Disposition: A | Discharge status: Home / Self Care | Payer: Medicare HMO

## 2023-04-04 DIAGNOSIS — M5431 Sciatica, right side: Secondary | ICD-10-CM | POA: Diagnosis not present

## 2023-04-04 DIAGNOSIS — M5432 Sciatica, left side: Secondary | ICD-10-CM

## 2023-04-04 MED ORDER — KETOROLAC TROMETHAMINE 30 MG/ML IJ SOLN
30.0000 mg | Freq: Once | INTRAMUSCULAR | Status: AC
  Administered 2023-04-04: 30 mg via INTRAMUSCULAR

## 2023-04-04 MED ORDER — TIZANIDINE HCL 2 MG PO CAPS: 2.0000 mg | ORAL_CAPSULE | Freq: Three times a day (TID) | ORAL | 0 refills | Status: AC | PRN

## 2023-04-04 NOTE — ED Triage Notes (Signed)
Pt reports intermittent lower leg pain for last several years. Pt reports most recent flare-up started yesterday. Denies any new injury but reports pain similar to when dx with sciatica.

## 2023-04-04 NOTE — Discharge Instructions (Addendum)
Do not take your meloxicam for the next 48 hours as the Toradol we have given you is a similar medication and will stay in your system for that long, you may take Tylenol for breakthrough pain if needed.  Use heat, stretches, massage and no heavy lifting or exertional activity.  Follow up with your primary care for a recheck next week.

## 2023-04-05 NOTE — ED Provider Notes (Signed)
[facility]    CSN: [id number] Arrival date & time: 04/04/23  1009      History   Chief Complaint Chief Complaint  Patient presents with   Leg Pain    HPI Annette Washington is a 67 y.o. female.   Patient presenting today with acute on chronic low back and buttock pain radiating down both legs posteriorly, worse with movement.  Denies any known new injury but states this happens off-and-on to her.  Denies bowel or bladder incontinence, saddle anesthesia, fever, chills, leg weakness.  Trying over-the-counter pain relievers with minimal relief.    Past Medical History:  Diagnosis Date   Diabetes mellitus without complication (HCC)    Hypertension    Pneumonia     Patient Active Problem List   Diagnosis Date Noted   Special screening for malignant neoplasms, colon 10/10/2016   Hypertension 12/23/2013    Past Surgical History:  Procedure Laterality Date   CARPAL TUNNEL RELEASE Right 06/18/2020   Procedure: CARPAL TUNNEL RELEASE;  Surgeon: Oliver Barre, MD;  Location: [facility];  Service: Orthopedics;  Laterality: Right;   CATARACT EXTRACTION W/PHACO Left 04/12/2018   Procedure: CATARACT EXTRACTION PHACO AND INTRAOCULAR LENS PLACEMENT (IOC);  Surgeon: Gemma Payor, MD;  Location: [facility];  Service: Ophthalmology;  Laterality: Left;  CDE:  7.79   CATARACT EXTRACTION W/PHACO Right 04/26/2018   Procedure: CATARACT EXTRACTION PHACO AND INTRAOCULAR LENS PLACEMENT RIGHT EYE CDE=6.51;  Surgeon: Gemma Payor, MD;  Location: [facility];  Service: Ophthalmology;  Laterality: Right;  right   COLONOSCOPY N/A 02/12/2017   Procedure: COLONOSCOPY;  Surgeon: Malissa Hippo, MD;  [facility];  Service: Endoscopy;  Laterality: N/A;  730   COLONOSCOPY WITH PROPOFOL N/A 04/18/2020   Procedure: COLONOSCOPY WITH PROPOFOL;  Surgeon: Malissa Hippo, MD;  [facility];  Service: Endoscopy;  Laterality: N/A;  830   TONSILLECTOMY      OB History     Gravida  3    Para  3   Term  3   Preterm      AB      Living  2      SAB      IAB      Ectopic      Multiple      Live Births  2            Home Medications    Prior to Admission medications   Medication Sig Start Date End Date Taking? Authorizing Provider  losartan (COZAAR) 100 MG tablet Take 100 mg by mouth daily.   Yes [provider]  tizanidine (ZANAFLEX) 2 MG capsule Take 1 capsule (2 mg total) by mouth 3 (three) times daily as needed for muscle spasms. Do not drink alcohol or drive while taking this medication.  May cause drowsiness. 04/04/23  Yes Particia Nearing, PA-C  amLODipine (NORVASC) 5 MG tablet Take 5 mg by mouth daily.    [provider]  azelastine (ASTELIN) 0.1 % nasal spray Place 1 spray into both nostrils daily as needed for allergies. 05/31/20   [provider]  benzonatate (TESSALON) 100 MG capsule Take 1 capsule by mouth every 8 (eight) hours for cough. 07/27/21   Mardella Layman, MD  cetirizine (ZYRTEC) 10 MG tablet Take 10 mg by mouth daily as needed for allergies.    [provider]  cholecalciferol (VITAMIN D3) 25 MCG (1000 UNIT) tablet Take 1,000 Units by mouth daily.  [provider]  ELDERBERRY PO Take 1 capsule by mouth daily.    [provider]  fluticasone (FLONASE) 50 MCG/ACT nasal spray Place 2 sprays into both nostrils daily as needed for allergies or rhinitis.    [provider]  ibuprofen (ADVIL) 200 MG tablet Take 400 mg by mouth every 6 (six) hours as needed for mild pain or headache.    [provider]  losartan-hydrochlorothiazide (HYZAAR) 50-12.5 MG tablet Take 1 tablet by mouth daily.    [provider]  metFORMIN (GLUCOPHAGE) 500 MG tablet Take 500 mg by mouth at bedtime.     [provider]  pravastatin (PRAVACHOL) 10 MG tablet Take 10 mg by mouth daily.    [provider]  predniSONE (DELTASONE) 20 MG tablet Take 2 tablets (40 mg total)  by mouth daily. 07/27/21   Mardella Layman, MD    Family History Family History  Problem Relation Age of Onset   Kidney disease Mother    Breast cancer Maternal Grandmother     Social History Social History   Tobacco Use   Smoking status: Former    Types: Cigarettes   Smokeless tobacco: Never  Vaping Use   Vaping status: Never Used  Substance Use Topics   Alcohol use: No   Drug use: No     Allergies   Tylenol [acetaminophen]   Review of Systems Review of Systems Per HPI  Physical Exam Triage Vital Signs ED Triage Vitals  Encounter Vitals Group     BP 04/04/23 1100 (!) 177/92     Systolic BP Percentile --      Diastolic BP Percentile --      Pulse Rate 04/04/23 1100 (!) 55     Resp 04/04/23 1100 20     Temp 04/04/23 1100 98.8 F (37.1 C)     Temp Source 04/04/23 1100 Oral     SpO2 04/04/23 1100 96 %     Weight --      Height --      Head Circumference --      Peak Flow --      Pain Score 04/04/23 1059 2     Pain Loc --      Pain Education --      Exclude from Growth Chart --    No data found.  Updated Vital Signs BP (!) 177/92 (BP Location: Right Arm)   Pulse (!) 55   Temp 98.8 F (37.1 C) (Oral)   Resp 20   SpO2 96%   Visual Acuity Right Eye Distance:   Left Eye Distance:   Bilateral Distance:    Right Eye Near:   Left Eye Near:    Bilateral Near:     Physical Exam Vitals and nursing note reviewed.  Constitutional:      Appearance: Normal appearance. She is not ill-appearing.  HENT:     Head: Atraumatic.  Eyes:     Extraocular Movements: Extraocular movements intact.     Conjunctiva/sclera: Conjunctivae normal.  Cardiovascular:     Rate and Rhythm: Normal rate and regular rhythm.     Heart sounds: Normal heart sounds.  Pulmonary:     Effort: Pulmonary effort is normal.     Breath sounds: Normal breath sounds.  Musculoskeletal:        General: Normal range of motion.     Cervical back: Normal range of motion and neck supple.      Comments: No midline spinal tenderness to palpation diffusely.  Tender to palpation to the lateral lumbar musculature bilaterally and bilateral buttocks.  Negative straight leg raise bilateral lower extremities.  Normal gait and range of motion  Skin:    General: Skin is warm and dry.  Neurological:     Mental Status: She is alert and oriented to person, place, and time.     Comments: Bilateral lower extremities neurovascularly intact  Psychiatric:        Mood and Affect: Mood normal.        Thought Content: Thought content normal.        Judgment: Judgment normal.      UC Treatments / Results  Labs (all labs ordered are listed, but only abnormal results are displayed) Labs Reviewed - No data to display  EKG   Radiology No results found.  Procedures Procedures (including critical care time)  Medications Ordered in UC Medications  ketorolac (TORADOL) 30 MG/ML injection 30 mg (30 mg Intramuscular Given 04/04/23 1134)    Initial Impression / Assessment and Plan / UC Course  I have reviewed the triage vital signs and the nursing notes.  Pertinent labs & imaging results that were available during my care of the patient were reviewed by me and considered in my medical decision making (see chart for details).     Treat with IM Toradol, Zanaflex, heat, stretches, massage, rest.  Return for worsening symptoms.  No red flag findings today.  Final Clinical Impressions(s) / UC Diagnoses   Final diagnoses:  Bilateral sciatica     Discharge Instructions      Do not take your meloxicam for the next 48 hours as the Toradol we have given you is a similar medication and will stay in your system for that long, you may take Tylenol for breakthrough pain if needed.  Use heat, stretches, massage and no heavy lifting or exertional activity.  Follow up with your primary care for a recheck next week.    ED Prescriptions     Medication Sig Dispense Auth. Provider   tizanidine  (ZANAFLEX) 2 MG capsule Take 1 capsule (2 mg total) by mouth 3 (three) times daily as needed for muscle spasms. Do not drink alcohol or drive while taking this medication.  May cause drowsiness. 15 capsule Particia Nearing, [place]      PDMP not reviewed this encounter.   Particia Nearing, [place] 04/05/23 1524

## 2023-04-27 DIAGNOSIS — M9906 Segmental and somatic dysfunction of lower extremity: Secondary | ICD-10-CM | POA: Diagnosis not present

## 2023-04-27 DIAGNOSIS — M9905 Segmental and somatic dysfunction of pelvic region: Secondary | ICD-10-CM | POA: Diagnosis not present

## 2023-04-27 DIAGNOSIS — M791 Myalgia, unspecified site: Secondary | ICD-10-CM | POA: Diagnosis not present

## 2023-04-27 DIAGNOSIS — M9901 Segmental and somatic dysfunction of cervical region: Secondary | ICD-10-CM | POA: Diagnosis not present

## 2023-04-27 DIAGNOSIS — M62838 Other muscle spasm: Secondary | ICD-10-CM | POA: Diagnosis not present

## 2023-04-27 DIAGNOSIS — M9903 Segmental and somatic dysfunction of lumbar region: Secondary | ICD-10-CM | POA: Diagnosis not present

## 2023-04-27 DIAGNOSIS — M9902 Segmental and somatic dysfunction of thoracic region: Secondary | ICD-10-CM | POA: Diagnosis not present

## 2023-04-27 DIAGNOSIS — M5412 Radiculopathy, cervical region: Secondary | ICD-10-CM | POA: Diagnosis not present

## 2023-04-27 DIAGNOSIS — E669 Obesity, unspecified: Secondary | ICD-10-CM | POA: Diagnosis not present

## 2023-04-27 DIAGNOSIS — M5417 Radiculopathy, lumbosacral region: Secondary | ICD-10-CM | POA: Diagnosis not present

## 2023-04-27 DIAGNOSIS — M5416 Radiculopathy, lumbar region: Secondary | ICD-10-CM | POA: Diagnosis not present

## 2023-04-27 DIAGNOSIS — R293 Abnormal posture: Secondary | ICD-10-CM | POA: Diagnosis not present

## 2023-04-27 DIAGNOSIS — M9907 Segmental and somatic dysfunction of upper extremity: Secondary | ICD-10-CM | POA: Diagnosis not present

## 2023-04-27 DIAGNOSIS — M545 Low back pain, unspecified: Secondary | ICD-10-CM | POA: Diagnosis not present

## 2023-05-04 DIAGNOSIS — M791 Myalgia, unspecified site: Secondary | ICD-10-CM | POA: Diagnosis not present

## 2023-05-04 DIAGNOSIS — E669 Obesity, unspecified: Secondary | ICD-10-CM | POA: Diagnosis not present

## 2023-05-04 DIAGNOSIS — M9903 Segmental and somatic dysfunction of lumbar region: Secondary | ICD-10-CM | POA: Diagnosis not present

## 2023-05-04 DIAGNOSIS — M9902 Segmental and somatic dysfunction of thoracic region: Secondary | ICD-10-CM | POA: Diagnosis not present

## 2023-05-04 DIAGNOSIS — M545 Low back pain, unspecified: Secondary | ICD-10-CM | POA: Diagnosis not present

## 2023-05-04 DIAGNOSIS — M9907 Segmental and somatic dysfunction of upper extremity: Secondary | ICD-10-CM | POA: Diagnosis not present

## 2023-05-04 DIAGNOSIS — M9905 Segmental and somatic dysfunction of pelvic region: Secondary | ICD-10-CM | POA: Diagnosis not present

## 2023-05-04 DIAGNOSIS — R293 Abnormal posture: Secondary | ICD-10-CM | POA: Diagnosis not present

## 2023-05-04 DIAGNOSIS — M5416 Radiculopathy, lumbar region: Secondary | ICD-10-CM | POA: Diagnosis not present

## 2023-05-04 DIAGNOSIS — M62838 Other muscle spasm: Secondary | ICD-10-CM | POA: Diagnosis not present

## 2023-05-04 DIAGNOSIS — M5412 Radiculopathy, cervical region: Secondary | ICD-10-CM | POA: Diagnosis not present

## 2023-05-04 DIAGNOSIS — M9901 Segmental and somatic dysfunction of cervical region: Secondary | ICD-10-CM | POA: Diagnosis not present

## 2023-05-04 DIAGNOSIS — M5417 Radiculopathy, lumbosacral region: Secondary | ICD-10-CM | POA: Diagnosis not present

## 2023-05-04 DIAGNOSIS — M9906 Segmental and somatic dysfunction of lower extremity: Secondary | ICD-10-CM | POA: Diagnosis not present

## 2023-05-06 DIAGNOSIS — M545 Low back pain, unspecified: Secondary | ICD-10-CM | POA: Diagnosis not present

## 2023-05-06 DIAGNOSIS — M9902 Segmental and somatic dysfunction of thoracic region: Secondary | ICD-10-CM | POA: Diagnosis not present

## 2023-05-06 DIAGNOSIS — R293 Abnormal posture: Secondary | ICD-10-CM | POA: Diagnosis not present

## 2023-05-06 DIAGNOSIS — M9906 Segmental and somatic dysfunction of lower extremity: Secondary | ICD-10-CM | POA: Diagnosis not present

## 2023-05-06 DIAGNOSIS — M5412 Radiculopathy, cervical region: Secondary | ICD-10-CM | POA: Diagnosis not present

## 2023-05-06 DIAGNOSIS — E669 Obesity, unspecified: Secondary | ICD-10-CM | POA: Diagnosis not present

## 2023-05-06 DIAGNOSIS — M9903 Segmental and somatic dysfunction of lumbar region: Secondary | ICD-10-CM | POA: Diagnosis not present

## 2023-05-06 DIAGNOSIS — M5416 Radiculopathy, lumbar region: Secondary | ICD-10-CM | POA: Diagnosis not present

## 2023-05-06 DIAGNOSIS — M5417 Radiculopathy, lumbosacral region: Secondary | ICD-10-CM | POA: Diagnosis not present

## 2023-05-06 DIAGNOSIS — M791 Myalgia, unspecified site: Secondary | ICD-10-CM | POA: Diagnosis not present

## 2023-05-06 DIAGNOSIS — M9901 Segmental and somatic dysfunction of cervical region: Secondary | ICD-10-CM | POA: Diagnosis not present

## 2023-05-06 DIAGNOSIS — M9907 Segmental and somatic dysfunction of upper extremity: Secondary | ICD-10-CM | POA: Diagnosis not present

## 2023-05-06 DIAGNOSIS — M9905 Segmental and somatic dysfunction of pelvic region: Secondary | ICD-10-CM | POA: Diagnosis not present

## 2023-05-06 DIAGNOSIS — M62838 Other muscle spasm: Secondary | ICD-10-CM | POA: Diagnosis not present

## 2023-05-11 DIAGNOSIS — E669 Obesity, unspecified: Secondary | ICD-10-CM | POA: Diagnosis not present

## 2023-05-11 DIAGNOSIS — Z23 Encounter for immunization: Secondary | ICD-10-CM | POA: Diagnosis not present

## 2023-05-11 DIAGNOSIS — M5412 Radiculopathy, cervical region: Secondary | ICD-10-CM | POA: Diagnosis not present

## 2023-05-11 DIAGNOSIS — M9903 Segmental and somatic dysfunction of lumbar region: Secondary | ICD-10-CM | POA: Diagnosis not present

## 2023-05-11 DIAGNOSIS — M5430 Sciatica, unspecified side: Secondary | ICD-10-CM | POA: Diagnosis not present

## 2023-05-11 DIAGNOSIS — M9902 Segmental and somatic dysfunction of thoracic region: Secondary | ICD-10-CM | POA: Diagnosis not present

## 2023-05-11 DIAGNOSIS — M5416 Radiculopathy, lumbar region: Secondary | ICD-10-CM | POA: Diagnosis not present

## 2023-05-11 DIAGNOSIS — M5431 Sciatica, right side: Secondary | ICD-10-CM | POA: Diagnosis not present

## 2023-05-11 DIAGNOSIS — M545 Low back pain, unspecified: Secondary | ICD-10-CM | POA: Diagnosis not present

## 2023-05-11 DIAGNOSIS — M5432 Sciatica, left side: Secondary | ICD-10-CM | POA: Diagnosis not present

## 2023-05-11 DIAGNOSIS — M9906 Segmental and somatic dysfunction of lower extremity: Secondary | ICD-10-CM | POA: Diagnosis not present

## 2023-05-11 DIAGNOSIS — M62838 Other muscle spasm: Secondary | ICD-10-CM | POA: Diagnosis not present

## 2023-05-11 DIAGNOSIS — M9901 Segmental and somatic dysfunction of cervical region: Secondary | ICD-10-CM | POA: Diagnosis not present

## 2023-05-11 DIAGNOSIS — M9907 Segmental and somatic dysfunction of upper extremity: Secondary | ICD-10-CM | POA: Diagnosis not present

## 2023-05-11 DIAGNOSIS — Z79899 Other long term (current) drug therapy: Secondary | ICD-10-CM | POA: Diagnosis not present

## 2023-05-11 DIAGNOSIS — R600 Localized edema: Secondary | ICD-10-CM | POA: Diagnosis not present

## 2023-05-11 DIAGNOSIS — M5417 Radiculopathy, lumbosacral region: Secondary | ICD-10-CM | POA: Diagnosis not present

## 2023-05-11 DIAGNOSIS — M791 Myalgia, unspecified site: Secondary | ICD-10-CM | POA: Diagnosis not present

## 2023-05-11 DIAGNOSIS — R293 Abnormal posture: Secondary | ICD-10-CM | POA: Diagnosis not present

## 2023-05-11 DIAGNOSIS — M9905 Segmental and somatic dysfunction of pelvic region: Secondary | ICD-10-CM | POA: Diagnosis not present

## 2023-05-11 DIAGNOSIS — I10 Essential (primary) hypertension: Secondary | ICD-10-CM | POA: Diagnosis not present

## 2023-05-13 DIAGNOSIS — E669 Obesity, unspecified: Secondary | ICD-10-CM | POA: Diagnosis not present

## 2023-05-13 DIAGNOSIS — M542 Cervicalgia: Secondary | ICD-10-CM | POA: Diagnosis not present

## 2023-05-13 DIAGNOSIS — M5417 Radiculopathy, lumbosacral region: Secondary | ICD-10-CM | POA: Diagnosis not present

## 2023-05-13 DIAGNOSIS — M9905 Segmental and somatic dysfunction of pelvic region: Secondary | ICD-10-CM | POA: Diagnosis not present

## 2023-05-13 DIAGNOSIS — M62838 Other muscle spasm: Secondary | ICD-10-CM | POA: Diagnosis not present

## 2023-05-13 DIAGNOSIS — M9902 Segmental and somatic dysfunction of thoracic region: Secondary | ICD-10-CM | POA: Diagnosis not present

## 2023-05-13 DIAGNOSIS — M791 Myalgia, unspecified site: Secondary | ICD-10-CM | POA: Diagnosis not present

## 2023-05-13 DIAGNOSIS — M9906 Segmental and somatic dysfunction of lower extremity: Secondary | ICD-10-CM | POA: Diagnosis not present

## 2023-05-13 DIAGNOSIS — M545 Low back pain, unspecified: Secondary | ICD-10-CM | POA: Diagnosis not present

## 2023-05-13 DIAGNOSIS — M9903 Segmental and somatic dysfunction of lumbar region: Secondary | ICD-10-CM | POA: Diagnosis not present

## 2023-05-13 DIAGNOSIS — M9907 Segmental and somatic dysfunction of upper extremity: Secondary | ICD-10-CM | POA: Diagnosis not present

## 2023-05-13 DIAGNOSIS — R293 Abnormal posture: Secondary | ICD-10-CM | POA: Diagnosis not present

## 2023-05-13 DIAGNOSIS — M5412 Radiculopathy, cervical region: Secondary | ICD-10-CM | POA: Diagnosis not present

## 2023-05-13 DIAGNOSIS — M5416 Radiculopathy, lumbar region: Secondary | ICD-10-CM | POA: Diagnosis not present

## 2023-05-13 DIAGNOSIS — M9901 Segmental and somatic dysfunction of cervical region: Secondary | ICD-10-CM | POA: Diagnosis not present

## 2023-05-18 DIAGNOSIS — R293 Abnormal posture: Secondary | ICD-10-CM | POA: Diagnosis not present

## 2023-05-18 DIAGNOSIS — E669 Obesity, unspecified: Secondary | ICD-10-CM | POA: Diagnosis not present

## 2023-05-18 DIAGNOSIS — M5417 Radiculopathy, lumbosacral region: Secondary | ICD-10-CM | POA: Diagnosis not present

## 2023-05-18 DIAGNOSIS — M9901 Segmental and somatic dysfunction of cervical region: Secondary | ICD-10-CM | POA: Diagnosis not present

## 2023-05-18 DIAGNOSIS — M9902 Segmental and somatic dysfunction of thoracic region: Secondary | ICD-10-CM | POA: Diagnosis not present

## 2023-05-18 DIAGNOSIS — M9907 Segmental and somatic dysfunction of upper extremity: Secondary | ICD-10-CM | POA: Diagnosis not present

## 2023-05-18 DIAGNOSIS — M545 Low back pain, unspecified: Secondary | ICD-10-CM | POA: Diagnosis not present

## 2023-05-18 DIAGNOSIS — M9906 Segmental and somatic dysfunction of lower extremity: Secondary | ICD-10-CM | POA: Diagnosis not present

## 2023-05-18 DIAGNOSIS — M9905 Segmental and somatic dysfunction of pelvic region: Secondary | ICD-10-CM | POA: Diagnosis not present

## 2023-05-18 DIAGNOSIS — M5412 Radiculopathy, cervical region: Secondary | ICD-10-CM | POA: Diagnosis not present

## 2023-05-18 DIAGNOSIS — M62838 Other muscle spasm: Secondary | ICD-10-CM | POA: Diagnosis not present

## 2023-05-18 DIAGNOSIS — M5416 Radiculopathy, lumbar region: Secondary | ICD-10-CM | POA: Diagnosis not present

## 2023-05-18 DIAGNOSIS — M791 Myalgia, unspecified site: Secondary | ICD-10-CM | POA: Diagnosis not present

## 2023-05-18 DIAGNOSIS — M9903 Segmental and somatic dysfunction of lumbar region: Secondary | ICD-10-CM | POA: Diagnosis not present

## 2023-05-20 DIAGNOSIS — M5412 Radiculopathy, cervical region: Secondary | ICD-10-CM | POA: Diagnosis not present

## 2023-05-20 DIAGNOSIS — M5416 Radiculopathy, lumbar region: Secondary | ICD-10-CM | POA: Diagnosis not present

## 2023-05-20 DIAGNOSIS — M9906 Segmental and somatic dysfunction of lower extremity: Secondary | ICD-10-CM | POA: Diagnosis not present

## 2023-05-20 DIAGNOSIS — M9905 Segmental and somatic dysfunction of pelvic region: Secondary | ICD-10-CM | POA: Diagnosis not present

## 2023-05-20 DIAGNOSIS — M9902 Segmental and somatic dysfunction of thoracic region: Secondary | ICD-10-CM | POA: Diagnosis not present

## 2023-05-20 DIAGNOSIS — M9903 Segmental and somatic dysfunction of lumbar region: Secondary | ICD-10-CM | POA: Diagnosis not present

## 2023-05-20 DIAGNOSIS — M9901 Segmental and somatic dysfunction of cervical region: Secondary | ICD-10-CM | POA: Diagnosis not present

## 2023-05-20 DIAGNOSIS — M62838 Other muscle spasm: Secondary | ICD-10-CM | POA: Diagnosis not present

## 2023-05-20 DIAGNOSIS — M9907 Segmental and somatic dysfunction of upper extremity: Secondary | ICD-10-CM | POA: Diagnosis not present

## 2023-05-21 DIAGNOSIS — H524 Presbyopia: Secondary | ICD-10-CM | POA: Diagnosis not present

## 2023-05-21 DIAGNOSIS — Z0100 Encounter for examination of eyes and vision without abnormal findings: Secondary | ICD-10-CM | POA: Diagnosis not present

## 2023-05-22 DIAGNOSIS — E119 Type 2 diabetes mellitus without complications: Secondary | ICD-10-CM | POA: Diagnosis not present

## 2023-05-25 DIAGNOSIS — M62838 Other muscle spasm: Secondary | ICD-10-CM | POA: Diagnosis not present

## 2023-05-25 DIAGNOSIS — M9905 Segmental and somatic dysfunction of pelvic region: Secondary | ICD-10-CM | POA: Diagnosis not present

## 2023-05-25 DIAGNOSIS — M9901 Segmental and somatic dysfunction of cervical region: Secondary | ICD-10-CM | POA: Diagnosis not present

## 2023-05-25 DIAGNOSIS — M5412 Radiculopathy, cervical region: Secondary | ICD-10-CM | POA: Diagnosis not present

## 2023-05-25 DIAGNOSIS — M9903 Segmental and somatic dysfunction of lumbar region: Secondary | ICD-10-CM | POA: Diagnosis not present

## 2023-05-25 DIAGNOSIS — M9902 Segmental and somatic dysfunction of thoracic region: Secondary | ICD-10-CM | POA: Diagnosis not present

## 2023-05-25 DIAGNOSIS — M9906 Segmental and somatic dysfunction of lower extremity: Secondary | ICD-10-CM | POA: Diagnosis not present

## 2023-05-25 DIAGNOSIS — M9907 Segmental and somatic dysfunction of upper extremity: Secondary | ICD-10-CM | POA: Diagnosis not present

## 2023-05-25 DIAGNOSIS — M5416 Radiculopathy, lumbar region: Secondary | ICD-10-CM | POA: Diagnosis not present

## 2023-05-27 DIAGNOSIS — M9903 Segmental and somatic dysfunction of lumbar region: Secondary | ICD-10-CM | POA: Diagnosis not present

## 2023-05-27 DIAGNOSIS — M9906 Segmental and somatic dysfunction of lower extremity: Secondary | ICD-10-CM | POA: Diagnosis not present

## 2023-05-27 DIAGNOSIS — M5412 Radiculopathy, cervical region: Secondary | ICD-10-CM | POA: Diagnosis not present

## 2023-05-27 DIAGNOSIS — M5417 Radiculopathy, lumbosacral region: Secondary | ICD-10-CM | POA: Diagnosis not present

## 2023-05-27 DIAGNOSIS — E669 Obesity, unspecified: Secondary | ICD-10-CM | POA: Diagnosis not present

## 2023-05-27 DIAGNOSIS — I10 Essential (primary) hypertension: Secondary | ICD-10-CM | POA: Diagnosis not present

## 2023-05-27 DIAGNOSIS — M5416 Radiculopathy, lumbar region: Secondary | ICD-10-CM | POA: Diagnosis not present

## 2023-05-27 DIAGNOSIS — M9901 Segmental and somatic dysfunction of cervical region: Secondary | ICD-10-CM | POA: Diagnosis not present

## 2023-05-27 DIAGNOSIS — M62838 Other muscle spasm: Secondary | ICD-10-CM | POA: Diagnosis not present

## 2023-05-27 DIAGNOSIS — M545 Low back pain, unspecified: Secondary | ICD-10-CM | POA: Diagnosis not present

## 2023-05-27 DIAGNOSIS — M9902 Segmental and somatic dysfunction of thoracic region: Secondary | ICD-10-CM | POA: Diagnosis not present

## 2023-05-27 DIAGNOSIS — M791 Myalgia, unspecified site: Secondary | ICD-10-CM | POA: Diagnosis not present

## 2023-05-27 DIAGNOSIS — M9907 Segmental and somatic dysfunction of upper extremity: Secondary | ICD-10-CM | POA: Diagnosis not present

## 2023-05-27 DIAGNOSIS — R293 Abnormal posture: Secondary | ICD-10-CM | POA: Diagnosis not present

## 2023-05-27 DIAGNOSIS — M9905 Segmental and somatic dysfunction of pelvic region: Secondary | ICD-10-CM | POA: Diagnosis not present

## 2023-06-01 DIAGNOSIS — M9906 Segmental and somatic dysfunction of lower extremity: Secondary | ICD-10-CM | POA: Diagnosis not present

## 2023-06-01 DIAGNOSIS — E669 Obesity, unspecified: Secondary | ICD-10-CM | POA: Diagnosis not present

## 2023-06-01 DIAGNOSIS — M9901 Segmental and somatic dysfunction of cervical region: Secondary | ICD-10-CM | POA: Diagnosis not present

## 2023-06-01 DIAGNOSIS — M545 Low back pain, unspecified: Secondary | ICD-10-CM | POA: Diagnosis not present

## 2023-06-01 DIAGNOSIS — M9903 Segmental and somatic dysfunction of lumbar region: Secondary | ICD-10-CM | POA: Diagnosis not present

## 2023-06-01 DIAGNOSIS — M5412 Radiculopathy, cervical region: Secondary | ICD-10-CM | POA: Diagnosis not present

## 2023-06-01 DIAGNOSIS — M9905 Segmental and somatic dysfunction of pelvic region: Secondary | ICD-10-CM | POA: Diagnosis not present

## 2023-06-01 DIAGNOSIS — M5416 Radiculopathy, lumbar region: Secondary | ICD-10-CM | POA: Diagnosis not present

## 2023-06-01 DIAGNOSIS — M791 Myalgia, unspecified site: Secondary | ICD-10-CM | POA: Diagnosis not present

## 2023-06-01 DIAGNOSIS — M5417 Radiculopathy, lumbosacral region: Secondary | ICD-10-CM | POA: Diagnosis not present

## 2023-06-01 DIAGNOSIS — M9902 Segmental and somatic dysfunction of thoracic region: Secondary | ICD-10-CM | POA: Diagnosis not present

## 2023-06-01 DIAGNOSIS — M9907 Segmental and somatic dysfunction of upper extremity: Secondary | ICD-10-CM | POA: Diagnosis not present

## 2023-06-01 DIAGNOSIS — R293 Abnormal posture: Secondary | ICD-10-CM | POA: Diagnosis not present

## 2023-06-01 DIAGNOSIS — M62838 Other muscle spasm: Secondary | ICD-10-CM | POA: Diagnosis not present

## 2023-06-03 DIAGNOSIS — M791 Myalgia, unspecified site: Secondary | ICD-10-CM | POA: Diagnosis not present

## 2023-06-03 DIAGNOSIS — M545 Low back pain, unspecified: Secondary | ICD-10-CM | POA: Diagnosis not present

## 2023-06-03 DIAGNOSIS — M9903 Segmental and somatic dysfunction of lumbar region: Secondary | ICD-10-CM | POA: Diagnosis not present

## 2023-06-03 DIAGNOSIS — M5412 Radiculopathy, cervical region: Secondary | ICD-10-CM | POA: Diagnosis not present

## 2023-06-03 DIAGNOSIS — M9907 Segmental and somatic dysfunction of upper extremity: Secondary | ICD-10-CM | POA: Diagnosis not present

## 2023-06-03 DIAGNOSIS — M9905 Segmental and somatic dysfunction of pelvic region: Secondary | ICD-10-CM | POA: Diagnosis not present

## 2023-06-03 DIAGNOSIS — M9906 Segmental and somatic dysfunction of lower extremity: Secondary | ICD-10-CM | POA: Diagnosis not present

## 2023-06-03 DIAGNOSIS — M9902 Segmental and somatic dysfunction of thoracic region: Secondary | ICD-10-CM | POA: Diagnosis not present

## 2023-06-03 DIAGNOSIS — R293 Abnormal posture: Secondary | ICD-10-CM | POA: Diagnosis not present

## 2023-06-03 DIAGNOSIS — E669 Obesity, unspecified: Secondary | ICD-10-CM | POA: Diagnosis not present

## 2023-06-03 DIAGNOSIS — M5416 Radiculopathy, lumbar region: Secondary | ICD-10-CM | POA: Diagnosis not present

## 2023-06-03 DIAGNOSIS — M62838 Other muscle spasm: Secondary | ICD-10-CM | POA: Diagnosis not present

## 2023-06-03 DIAGNOSIS — M5417 Radiculopathy, lumbosacral region: Secondary | ICD-10-CM | POA: Diagnosis not present

## 2023-06-03 DIAGNOSIS — M9901 Segmental and somatic dysfunction of cervical region: Secondary | ICD-10-CM | POA: Diagnosis not present

## 2023-06-08 DIAGNOSIS — M9905 Segmental and somatic dysfunction of pelvic region: Secondary | ICD-10-CM | POA: Diagnosis not present

## 2023-06-08 DIAGNOSIS — M5417 Radiculopathy, lumbosacral region: Secondary | ICD-10-CM | POA: Diagnosis not present

## 2023-06-08 DIAGNOSIS — M9901 Segmental and somatic dysfunction of cervical region: Secondary | ICD-10-CM | POA: Diagnosis not present

## 2023-06-08 DIAGNOSIS — M9902 Segmental and somatic dysfunction of thoracic region: Secondary | ICD-10-CM | POA: Diagnosis not present

## 2023-06-08 DIAGNOSIS — M545 Low back pain, unspecified: Secondary | ICD-10-CM | POA: Diagnosis not present

## 2023-06-08 DIAGNOSIS — M9906 Segmental and somatic dysfunction of lower extremity: Secondary | ICD-10-CM | POA: Diagnosis not present

## 2023-06-08 DIAGNOSIS — M9907 Segmental and somatic dysfunction of upper extremity: Secondary | ICD-10-CM | POA: Diagnosis not present

## 2023-06-08 DIAGNOSIS — M5416 Radiculopathy, lumbar region: Secondary | ICD-10-CM | POA: Diagnosis not present

## 2023-06-08 DIAGNOSIS — M62838 Other muscle spasm: Secondary | ICD-10-CM | POA: Diagnosis not present

## 2023-06-08 DIAGNOSIS — M5412 Radiculopathy, cervical region: Secondary | ICD-10-CM | POA: Diagnosis not present

## 2023-06-08 DIAGNOSIS — M791 Myalgia, unspecified site: Secondary | ICD-10-CM | POA: Diagnosis not present

## 2023-06-08 DIAGNOSIS — E669 Obesity, unspecified: Secondary | ICD-10-CM | POA: Diagnosis not present

## 2023-06-08 DIAGNOSIS — M9903 Segmental and somatic dysfunction of lumbar region: Secondary | ICD-10-CM | POA: Diagnosis not present

## 2023-06-08 DIAGNOSIS — R293 Abnormal posture: Secondary | ICD-10-CM | POA: Diagnosis not present

## 2023-06-10 DIAGNOSIS — M9903 Segmental and somatic dysfunction of lumbar region: Secondary | ICD-10-CM | POA: Diagnosis not present

## 2023-06-10 DIAGNOSIS — E669 Obesity, unspecified: Secondary | ICD-10-CM | POA: Diagnosis not present

## 2023-06-10 DIAGNOSIS — R293 Abnormal posture: Secondary | ICD-10-CM | POA: Diagnosis not present

## 2023-06-10 DIAGNOSIS — M9907 Segmental and somatic dysfunction of upper extremity: Secondary | ICD-10-CM | POA: Diagnosis not present

## 2023-06-10 DIAGNOSIS — M9901 Segmental and somatic dysfunction of cervical region: Secondary | ICD-10-CM | POA: Diagnosis not present

## 2023-06-10 DIAGNOSIS — M5412 Radiculopathy, cervical region: Secondary | ICD-10-CM | POA: Diagnosis not present

## 2023-06-10 DIAGNOSIS — M791 Myalgia, unspecified site: Secondary | ICD-10-CM | POA: Diagnosis not present

## 2023-06-10 DIAGNOSIS — M9905 Segmental and somatic dysfunction of pelvic region: Secondary | ICD-10-CM | POA: Diagnosis not present

## 2023-06-10 DIAGNOSIS — M5417 Radiculopathy, lumbosacral region: Secondary | ICD-10-CM | POA: Diagnosis not present

## 2023-06-10 DIAGNOSIS — M9906 Segmental and somatic dysfunction of lower extremity: Secondary | ICD-10-CM | POA: Diagnosis not present

## 2023-06-10 DIAGNOSIS — M62838 Other muscle spasm: Secondary | ICD-10-CM | POA: Diagnosis not present

## 2023-06-10 DIAGNOSIS — M545 Low back pain, unspecified: Secondary | ICD-10-CM | POA: Diagnosis not present

## 2023-06-10 DIAGNOSIS — M5416 Radiculopathy, lumbar region: Secondary | ICD-10-CM | POA: Diagnosis not present

## 2023-06-10 DIAGNOSIS — M9902 Segmental and somatic dysfunction of thoracic region: Secondary | ICD-10-CM | POA: Diagnosis not present

## 2023-06-15 DIAGNOSIS — M9906 Segmental and somatic dysfunction of lower extremity: Secondary | ICD-10-CM | POA: Diagnosis not present

## 2023-06-15 DIAGNOSIS — E669 Obesity, unspecified: Secondary | ICD-10-CM | POA: Diagnosis not present

## 2023-06-15 DIAGNOSIS — M791 Myalgia, unspecified site: Secondary | ICD-10-CM | POA: Diagnosis not present

## 2023-06-15 DIAGNOSIS — M9907 Segmental and somatic dysfunction of upper extremity: Secondary | ICD-10-CM | POA: Diagnosis not present

## 2023-06-15 DIAGNOSIS — M9903 Segmental and somatic dysfunction of lumbar region: Secondary | ICD-10-CM | POA: Diagnosis not present

## 2023-06-15 DIAGNOSIS — M9901 Segmental and somatic dysfunction of cervical region: Secondary | ICD-10-CM | POA: Diagnosis not present

## 2023-06-15 DIAGNOSIS — M9902 Segmental and somatic dysfunction of thoracic region: Secondary | ICD-10-CM | POA: Diagnosis not present

## 2023-06-15 DIAGNOSIS — M62838 Other muscle spasm: Secondary | ICD-10-CM | POA: Diagnosis not present

## 2023-06-15 DIAGNOSIS — M5412 Radiculopathy, cervical region: Secondary | ICD-10-CM | POA: Diagnosis not present

## 2023-06-15 DIAGNOSIS — M545 Low back pain, unspecified: Secondary | ICD-10-CM | POA: Diagnosis not present

## 2023-06-15 DIAGNOSIS — M9905 Segmental and somatic dysfunction of pelvic region: Secondary | ICD-10-CM | POA: Diagnosis not present

## 2023-06-15 DIAGNOSIS — R293 Abnormal posture: Secondary | ICD-10-CM | POA: Diagnosis not present

## 2023-06-15 DIAGNOSIS — M5416 Radiculopathy, lumbar region: Secondary | ICD-10-CM | POA: Diagnosis not present

## 2023-06-15 DIAGNOSIS — M5417 Radiculopathy, lumbosacral region: Secondary | ICD-10-CM | POA: Diagnosis not present

## 2023-06-17 DIAGNOSIS — M9905 Segmental and somatic dysfunction of pelvic region: Secondary | ICD-10-CM | POA: Diagnosis not present

## 2023-06-17 DIAGNOSIS — M5412 Radiculopathy, cervical region: Secondary | ICD-10-CM | POA: Diagnosis not present

## 2023-06-17 DIAGNOSIS — M9902 Segmental and somatic dysfunction of thoracic region: Secondary | ICD-10-CM | POA: Diagnosis not present

## 2023-06-17 DIAGNOSIS — M9901 Segmental and somatic dysfunction of cervical region: Secondary | ICD-10-CM | POA: Diagnosis not present

## 2023-06-17 DIAGNOSIS — E669 Obesity, unspecified: Secondary | ICD-10-CM | POA: Diagnosis not present

## 2023-06-17 DIAGNOSIS — M9907 Segmental and somatic dysfunction of upper extremity: Secondary | ICD-10-CM | POA: Diagnosis not present

## 2023-06-17 DIAGNOSIS — M9906 Segmental and somatic dysfunction of lower extremity: Secondary | ICD-10-CM | POA: Diagnosis not present

## 2023-06-17 DIAGNOSIS — M791 Myalgia, unspecified site: Secondary | ICD-10-CM | POA: Diagnosis not present

## 2023-06-17 DIAGNOSIS — M545 Low back pain, unspecified: Secondary | ICD-10-CM | POA: Diagnosis not present

## 2023-06-17 DIAGNOSIS — M62838 Other muscle spasm: Secondary | ICD-10-CM | POA: Diagnosis not present

## 2023-06-17 DIAGNOSIS — M5417 Radiculopathy, lumbosacral region: Secondary | ICD-10-CM | POA: Diagnosis not present

## 2023-06-17 DIAGNOSIS — M5416 Radiculopathy, lumbar region: Secondary | ICD-10-CM | POA: Diagnosis not present

## 2023-06-17 DIAGNOSIS — M9903 Segmental and somatic dysfunction of lumbar region: Secondary | ICD-10-CM | POA: Diagnosis not present

## 2023-06-17 DIAGNOSIS — R293 Abnormal posture: Secondary | ICD-10-CM | POA: Diagnosis not present

## 2023-06-22 DIAGNOSIS — I10 Essential (primary) hypertension: Secondary | ICD-10-CM | POA: Diagnosis not present

## 2023-06-22 DIAGNOSIS — E1165 Type 2 diabetes mellitus with hyperglycemia: Secondary | ICD-10-CM | POA: Diagnosis not present

## 2023-06-25 DIAGNOSIS — M5430 Sciatica, unspecified side: Secondary | ICD-10-CM | POA: Diagnosis not present

## 2023-06-25 DIAGNOSIS — E782 Mixed hyperlipidemia: Secondary | ICD-10-CM | POA: Diagnosis not present

## 2023-06-25 DIAGNOSIS — R600 Localized edema: Secondary | ICD-10-CM | POA: Diagnosis not present

## 2023-06-25 DIAGNOSIS — I10 Essential (primary) hypertension: Secondary | ICD-10-CM | POA: Diagnosis not present

## 2023-06-25 DIAGNOSIS — E1165 Type 2 diabetes mellitus with hyperglycemia: Secondary | ICD-10-CM | POA: Diagnosis not present

## 2023-06-29 DIAGNOSIS — M9901 Segmental and somatic dysfunction of cervical region: Secondary | ICD-10-CM | POA: Diagnosis not present

## 2023-06-29 DIAGNOSIS — E669 Obesity, unspecified: Secondary | ICD-10-CM | POA: Diagnosis not present

## 2023-06-29 DIAGNOSIS — M9905 Segmental and somatic dysfunction of pelvic region: Secondary | ICD-10-CM | POA: Diagnosis not present

## 2023-06-29 DIAGNOSIS — M791 Myalgia, unspecified site: Secondary | ICD-10-CM | POA: Diagnosis not present

## 2023-06-29 DIAGNOSIS — M5416 Radiculopathy, lumbar region: Secondary | ICD-10-CM | POA: Diagnosis not present

## 2023-06-29 DIAGNOSIS — M5417 Radiculopathy, lumbosacral region: Secondary | ICD-10-CM | POA: Diagnosis not present

## 2023-06-29 DIAGNOSIS — M9902 Segmental and somatic dysfunction of thoracic region: Secondary | ICD-10-CM | POA: Diagnosis not present

## 2023-06-29 DIAGNOSIS — M5412 Radiculopathy, cervical region: Secondary | ICD-10-CM | POA: Diagnosis not present

## 2023-06-29 DIAGNOSIS — M9903 Segmental and somatic dysfunction of lumbar region: Secondary | ICD-10-CM | POA: Diagnosis not present

## 2023-06-29 DIAGNOSIS — R293 Abnormal posture: Secondary | ICD-10-CM | POA: Diagnosis not present

## 2023-06-29 DIAGNOSIS — M9907 Segmental and somatic dysfunction of upper extremity: Secondary | ICD-10-CM | POA: Diagnosis not present

## 2023-06-29 DIAGNOSIS — M9906 Segmental and somatic dysfunction of lower extremity: Secondary | ICD-10-CM | POA: Diagnosis not present

## 2023-06-29 DIAGNOSIS — M62838 Other muscle spasm: Secondary | ICD-10-CM | POA: Diagnosis not present

## 2023-06-29 DIAGNOSIS — M545 Low back pain, unspecified: Secondary | ICD-10-CM | POA: Diagnosis not present

## 2023-07-13 DIAGNOSIS — M9901 Segmental and somatic dysfunction of cervical region: Secondary | ICD-10-CM | POA: Diagnosis not present

## 2023-07-13 DIAGNOSIS — M62838 Other muscle spasm: Secondary | ICD-10-CM | POA: Diagnosis not present

## 2023-07-13 DIAGNOSIS — M9907 Segmental and somatic dysfunction of upper extremity: Secondary | ICD-10-CM | POA: Diagnosis not present

## 2023-07-13 DIAGNOSIS — M9903 Segmental and somatic dysfunction of lumbar region: Secondary | ICD-10-CM | POA: Diagnosis not present

## 2023-07-13 DIAGNOSIS — M5412 Radiculopathy, cervical region: Secondary | ICD-10-CM | POA: Diagnosis not present

## 2023-07-13 DIAGNOSIS — M9905 Segmental and somatic dysfunction of pelvic region: Secondary | ICD-10-CM | POA: Diagnosis not present

## 2023-07-13 DIAGNOSIS — M791 Myalgia, unspecified site: Secondary | ICD-10-CM | POA: Diagnosis not present

## 2023-07-13 DIAGNOSIS — M545 Low back pain, unspecified: Secondary | ICD-10-CM | POA: Diagnosis not present

## 2023-07-13 DIAGNOSIS — M5416 Radiculopathy, lumbar region: Secondary | ICD-10-CM | POA: Diagnosis not present

## 2023-07-13 DIAGNOSIS — E669 Obesity, unspecified: Secondary | ICD-10-CM | POA: Diagnosis not present

## 2023-07-13 DIAGNOSIS — M9902 Segmental and somatic dysfunction of thoracic region: Secondary | ICD-10-CM | POA: Diagnosis not present

## 2023-07-13 DIAGNOSIS — R293 Abnormal posture: Secondary | ICD-10-CM | POA: Diagnosis not present

## 2023-07-13 DIAGNOSIS — M9906 Segmental and somatic dysfunction of lower extremity: Secondary | ICD-10-CM | POA: Diagnosis not present

## 2023-07-13 DIAGNOSIS — M5417 Radiculopathy, lumbosacral region: Secondary | ICD-10-CM | POA: Diagnosis not present

## 2023-07-15 DIAGNOSIS — M545 Low back pain, unspecified: Secondary | ICD-10-CM | POA: Diagnosis not present

## 2023-07-15 DIAGNOSIS — M9906 Segmental and somatic dysfunction of lower extremity: Secondary | ICD-10-CM | POA: Diagnosis not present

## 2023-07-15 DIAGNOSIS — M9905 Segmental and somatic dysfunction of pelvic region: Secondary | ICD-10-CM | POA: Diagnosis not present

## 2023-07-15 DIAGNOSIS — M9902 Segmental and somatic dysfunction of thoracic region: Secondary | ICD-10-CM | POA: Diagnosis not present

## 2023-07-15 DIAGNOSIS — M5416 Radiculopathy, lumbar region: Secondary | ICD-10-CM | POA: Diagnosis not present

## 2023-07-15 DIAGNOSIS — M9903 Segmental and somatic dysfunction of lumbar region: Secondary | ICD-10-CM | POA: Diagnosis not present

## 2023-07-15 DIAGNOSIS — M9901 Segmental and somatic dysfunction of cervical region: Secondary | ICD-10-CM | POA: Diagnosis not present

## 2023-07-15 DIAGNOSIS — M5417 Radiculopathy, lumbosacral region: Secondary | ICD-10-CM | POA: Diagnosis not present

## 2023-07-15 DIAGNOSIS — M9907 Segmental and somatic dysfunction of upper extremity: Secondary | ICD-10-CM | POA: Diagnosis not present

## 2023-07-15 DIAGNOSIS — R293 Abnormal posture: Secondary | ICD-10-CM | POA: Diagnosis not present

## 2023-07-15 DIAGNOSIS — E669 Obesity, unspecified: Secondary | ICD-10-CM | POA: Diagnosis not present

## 2023-07-15 DIAGNOSIS — M62838 Other muscle spasm: Secondary | ICD-10-CM | POA: Diagnosis not present

## 2023-07-15 DIAGNOSIS — M5412 Radiculopathy, cervical region: Secondary | ICD-10-CM | POA: Diagnosis not present

## 2023-07-15 DIAGNOSIS — M791 Myalgia, unspecified site: Secondary | ICD-10-CM | POA: Diagnosis not present

## 2023-07-27 DIAGNOSIS — M9902 Segmental and somatic dysfunction of thoracic region: Secondary | ICD-10-CM | POA: Diagnosis not present

## 2023-07-27 DIAGNOSIS — M9905 Segmental and somatic dysfunction of pelvic region: Secondary | ICD-10-CM | POA: Diagnosis not present

## 2023-07-27 DIAGNOSIS — E669 Obesity, unspecified: Secondary | ICD-10-CM | POA: Diagnosis not present

## 2023-07-27 DIAGNOSIS — M791 Myalgia, unspecified site: Secondary | ICD-10-CM | POA: Diagnosis not present

## 2023-07-27 DIAGNOSIS — M5412 Radiculopathy, cervical region: Secondary | ICD-10-CM | POA: Diagnosis not present

## 2023-07-27 DIAGNOSIS — M62838 Other muscle spasm: Secondary | ICD-10-CM | POA: Diagnosis not present

## 2023-07-27 DIAGNOSIS — M9901 Segmental and somatic dysfunction of cervical region: Secondary | ICD-10-CM | POA: Diagnosis not present

## 2023-07-27 DIAGNOSIS — M9907 Segmental and somatic dysfunction of upper extremity: Secondary | ICD-10-CM | POA: Diagnosis not present

## 2023-07-27 DIAGNOSIS — M545 Low back pain, unspecified: Secondary | ICD-10-CM | POA: Diagnosis not present

## 2023-07-27 DIAGNOSIS — M5417 Radiculopathy, lumbosacral region: Secondary | ICD-10-CM | POA: Diagnosis not present

## 2023-07-27 DIAGNOSIS — M5416 Radiculopathy, lumbar region: Secondary | ICD-10-CM | POA: Diagnosis not present

## 2023-07-27 DIAGNOSIS — M9906 Segmental and somatic dysfunction of lower extremity: Secondary | ICD-10-CM | POA: Diagnosis not present

## 2023-07-27 DIAGNOSIS — M9903 Segmental and somatic dysfunction of lumbar region: Secondary | ICD-10-CM | POA: Diagnosis not present

## 2023-07-27 DIAGNOSIS — R293 Abnormal posture: Secondary | ICD-10-CM | POA: Diagnosis not present

## 2023-07-29 DIAGNOSIS — M545 Low back pain, unspecified: Secondary | ICD-10-CM | POA: Diagnosis not present

## 2023-07-29 DIAGNOSIS — M62838 Other muscle spasm: Secondary | ICD-10-CM | POA: Diagnosis not present

## 2023-07-29 DIAGNOSIS — M9901 Segmental and somatic dysfunction of cervical region: Secondary | ICD-10-CM | POA: Diagnosis not present

## 2023-07-29 DIAGNOSIS — R293 Abnormal posture: Secondary | ICD-10-CM | POA: Diagnosis not present

## 2023-07-29 DIAGNOSIS — M9907 Segmental and somatic dysfunction of upper extremity: Secondary | ICD-10-CM | POA: Diagnosis not present

## 2023-07-29 DIAGNOSIS — M5416 Radiculopathy, lumbar region: Secondary | ICD-10-CM | POA: Diagnosis not present

## 2023-07-29 DIAGNOSIS — M5412 Radiculopathy, cervical region: Secondary | ICD-10-CM | POA: Diagnosis not present

## 2023-07-29 DIAGNOSIS — M9903 Segmental and somatic dysfunction of lumbar region: Secondary | ICD-10-CM | POA: Diagnosis not present

## 2023-07-29 DIAGNOSIS — M9905 Segmental and somatic dysfunction of pelvic region: Secondary | ICD-10-CM | POA: Diagnosis not present

## 2023-07-29 DIAGNOSIS — M9902 Segmental and somatic dysfunction of thoracic region: Secondary | ICD-10-CM | POA: Diagnosis not present

## 2023-07-29 DIAGNOSIS — M791 Myalgia, unspecified site: Secondary | ICD-10-CM | POA: Diagnosis not present

## 2023-07-29 DIAGNOSIS — M9906 Segmental and somatic dysfunction of lower extremity: Secondary | ICD-10-CM | POA: Diagnosis not present

## 2023-07-29 DIAGNOSIS — M5417 Radiculopathy, lumbosacral region: Secondary | ICD-10-CM | POA: Diagnosis not present

## 2023-07-29 DIAGNOSIS — E669 Obesity, unspecified: Secondary | ICD-10-CM | POA: Diagnosis not present

## 2023-08-03 DIAGNOSIS — M5416 Radiculopathy, lumbar region: Secondary | ICD-10-CM | POA: Diagnosis not present

## 2023-08-03 DIAGNOSIS — M791 Myalgia, unspecified site: Secondary | ICD-10-CM | POA: Diagnosis not present

## 2023-08-03 DIAGNOSIS — M545 Low back pain, unspecified: Secondary | ICD-10-CM | POA: Diagnosis not present

## 2023-08-03 DIAGNOSIS — M9906 Segmental and somatic dysfunction of lower extremity: Secondary | ICD-10-CM | POA: Diagnosis not present

## 2023-08-03 DIAGNOSIS — M9902 Segmental and somatic dysfunction of thoracic region: Secondary | ICD-10-CM | POA: Diagnosis not present

## 2023-08-03 DIAGNOSIS — M5412 Radiculopathy, cervical region: Secondary | ICD-10-CM | POA: Diagnosis not present

## 2023-08-03 DIAGNOSIS — E669 Obesity, unspecified: Secondary | ICD-10-CM | POA: Diagnosis not present

## 2023-08-03 DIAGNOSIS — M9905 Segmental and somatic dysfunction of pelvic region: Secondary | ICD-10-CM | POA: Diagnosis not present

## 2023-08-03 DIAGNOSIS — M9907 Segmental and somatic dysfunction of upper extremity: Secondary | ICD-10-CM | POA: Diagnosis not present

## 2023-08-03 DIAGNOSIS — M9903 Segmental and somatic dysfunction of lumbar region: Secondary | ICD-10-CM | POA: Diagnosis not present

## 2023-08-03 DIAGNOSIS — R293 Abnormal posture: Secondary | ICD-10-CM | POA: Diagnosis not present

## 2023-08-03 DIAGNOSIS — M62838 Other muscle spasm: Secondary | ICD-10-CM | POA: Diagnosis not present

## 2023-08-03 DIAGNOSIS — M5417 Radiculopathy, lumbosacral region: Secondary | ICD-10-CM | POA: Diagnosis not present

## 2023-08-03 DIAGNOSIS — M9901 Segmental and somatic dysfunction of cervical region: Secondary | ICD-10-CM | POA: Diagnosis not present

## 2023-08-05 DIAGNOSIS — M791 Myalgia, unspecified site: Secondary | ICD-10-CM | POA: Diagnosis not present

## 2023-08-05 DIAGNOSIS — R293 Abnormal posture: Secondary | ICD-10-CM | POA: Diagnosis not present

## 2023-08-05 DIAGNOSIS — M9902 Segmental and somatic dysfunction of thoracic region: Secondary | ICD-10-CM | POA: Diagnosis not present

## 2023-08-05 DIAGNOSIS — M9903 Segmental and somatic dysfunction of lumbar region: Secondary | ICD-10-CM | POA: Diagnosis not present

## 2023-08-05 DIAGNOSIS — E669 Obesity, unspecified: Secondary | ICD-10-CM | POA: Diagnosis not present

## 2023-08-05 DIAGNOSIS — M9905 Segmental and somatic dysfunction of pelvic region: Secondary | ICD-10-CM | POA: Diagnosis not present

## 2023-08-05 DIAGNOSIS — M9901 Segmental and somatic dysfunction of cervical region: Secondary | ICD-10-CM | POA: Diagnosis not present

## 2023-08-05 DIAGNOSIS — M5416 Radiculopathy, lumbar region: Secondary | ICD-10-CM | POA: Diagnosis not present

## 2023-08-05 DIAGNOSIS — M545 Low back pain, unspecified: Secondary | ICD-10-CM | POA: Diagnosis not present

## 2023-08-05 DIAGNOSIS — M62838 Other muscle spasm: Secondary | ICD-10-CM | POA: Diagnosis not present

## 2023-08-05 DIAGNOSIS — M5412 Radiculopathy, cervical region: Secondary | ICD-10-CM | POA: Diagnosis not present

## 2023-08-05 DIAGNOSIS — M9906 Segmental and somatic dysfunction of lower extremity: Secondary | ICD-10-CM | POA: Diagnosis not present

## 2023-08-05 DIAGNOSIS — M5417 Radiculopathy, lumbosacral region: Secondary | ICD-10-CM | POA: Diagnosis not present

## 2023-08-05 DIAGNOSIS — M9907 Segmental and somatic dysfunction of upper extremity: Secondary | ICD-10-CM | POA: Diagnosis not present

## 2023-08-10 DIAGNOSIS — M5416 Radiculopathy, lumbar region: Secondary | ICD-10-CM | POA: Diagnosis not present

## 2023-08-10 DIAGNOSIS — M62838 Other muscle spasm: Secondary | ICD-10-CM | POA: Diagnosis not present

## 2023-08-10 DIAGNOSIS — M5412 Radiculopathy, cervical region: Secondary | ICD-10-CM | POA: Diagnosis not present

## 2023-08-10 DIAGNOSIS — M9905 Segmental and somatic dysfunction of pelvic region: Secondary | ICD-10-CM | POA: Diagnosis not present

## 2023-08-10 DIAGNOSIS — M9902 Segmental and somatic dysfunction of thoracic region: Secondary | ICD-10-CM | POA: Diagnosis not present

## 2023-08-10 DIAGNOSIS — M9903 Segmental and somatic dysfunction of lumbar region: Secondary | ICD-10-CM | POA: Diagnosis not present

## 2023-08-10 DIAGNOSIS — M9906 Segmental and somatic dysfunction of lower extremity: Secondary | ICD-10-CM | POA: Diagnosis not present

## 2023-08-10 DIAGNOSIS — M9901 Segmental and somatic dysfunction of cervical region: Secondary | ICD-10-CM | POA: Diagnosis not present

## 2023-08-10 DIAGNOSIS — M9907 Segmental and somatic dysfunction of upper extremity: Secondary | ICD-10-CM | POA: Diagnosis not present

## 2023-08-17 DIAGNOSIS — M9906 Segmental and somatic dysfunction of lower extremity: Secondary | ICD-10-CM | POA: Diagnosis not present

## 2023-08-17 DIAGNOSIS — R293 Abnormal posture: Secondary | ICD-10-CM | POA: Diagnosis not present

## 2023-08-17 DIAGNOSIS — M62838 Other muscle spasm: Secondary | ICD-10-CM | POA: Diagnosis not present

## 2023-08-17 DIAGNOSIS — M9903 Segmental and somatic dysfunction of lumbar region: Secondary | ICD-10-CM | POA: Diagnosis not present

## 2023-08-17 DIAGNOSIS — M9902 Segmental and somatic dysfunction of thoracic region: Secondary | ICD-10-CM | POA: Diagnosis not present

## 2023-08-17 DIAGNOSIS — M5417 Radiculopathy, lumbosacral region: Secondary | ICD-10-CM | POA: Diagnosis not present

## 2023-08-17 DIAGNOSIS — M9901 Segmental and somatic dysfunction of cervical region: Secondary | ICD-10-CM | POA: Diagnosis not present

## 2023-08-17 DIAGNOSIS — M791 Myalgia, unspecified site: Secondary | ICD-10-CM | POA: Diagnosis not present

## 2023-08-17 DIAGNOSIS — M9907 Segmental and somatic dysfunction of upper extremity: Secondary | ICD-10-CM | POA: Diagnosis not present

## 2023-08-17 DIAGNOSIS — M5412 Radiculopathy, cervical region: Secondary | ICD-10-CM | POA: Diagnosis not present

## 2023-08-17 DIAGNOSIS — M545 Low back pain, unspecified: Secondary | ICD-10-CM | POA: Diagnosis not present

## 2023-08-17 DIAGNOSIS — M9905 Segmental and somatic dysfunction of pelvic region: Secondary | ICD-10-CM | POA: Diagnosis not present

## 2023-08-17 DIAGNOSIS — M5416 Radiculopathy, lumbar region: Secondary | ICD-10-CM | POA: Diagnosis not present

## 2023-08-17 DIAGNOSIS — E669 Obesity, unspecified: Secondary | ICD-10-CM | POA: Diagnosis not present

## 2023-08-24 DIAGNOSIS — M9907 Segmental and somatic dysfunction of upper extremity: Secondary | ICD-10-CM | POA: Diagnosis not present

## 2023-08-24 DIAGNOSIS — M9902 Segmental and somatic dysfunction of thoracic region: Secondary | ICD-10-CM | POA: Diagnosis not present

## 2023-08-24 DIAGNOSIS — M9903 Segmental and somatic dysfunction of lumbar region: Secondary | ICD-10-CM | POA: Diagnosis not present

## 2023-08-24 DIAGNOSIS — M9901 Segmental and somatic dysfunction of cervical region: Secondary | ICD-10-CM | POA: Diagnosis not present

## 2023-08-24 DIAGNOSIS — M791 Myalgia, unspecified site: Secondary | ICD-10-CM | POA: Diagnosis not present

## 2023-08-24 DIAGNOSIS — M9905 Segmental and somatic dysfunction of pelvic region: Secondary | ICD-10-CM | POA: Diagnosis not present

## 2023-08-24 DIAGNOSIS — M5412 Radiculopathy, cervical region: Secondary | ICD-10-CM | POA: Diagnosis not present

## 2023-08-24 DIAGNOSIS — M9906 Segmental and somatic dysfunction of lower extremity: Secondary | ICD-10-CM | POA: Diagnosis not present

## 2023-08-24 DIAGNOSIS — M5416 Radiculopathy, lumbar region: Secondary | ICD-10-CM | POA: Diagnosis not present

## 2023-08-24 DIAGNOSIS — M62838 Other muscle spasm: Secondary | ICD-10-CM | POA: Diagnosis not present

## 2023-08-24 DIAGNOSIS — E669 Obesity, unspecified: Secondary | ICD-10-CM | POA: Diagnosis not present

## 2023-08-24 DIAGNOSIS — M545 Low back pain, unspecified: Secondary | ICD-10-CM | POA: Diagnosis not present

## 2023-08-24 DIAGNOSIS — R293 Abnormal posture: Secondary | ICD-10-CM | POA: Diagnosis not present

## 2023-08-24 DIAGNOSIS — M5417 Radiculopathy, lumbosacral region: Secondary | ICD-10-CM | POA: Diagnosis not present

## 2023-10-20 DIAGNOSIS — M5442 Lumbago with sciatica, left side: Secondary | ICD-10-CM | POA: Diagnosis not present

## 2023-10-20 DIAGNOSIS — M5441 Lumbago with sciatica, right side: Secondary | ICD-10-CM | POA: Diagnosis not present

## 2023-10-20 DIAGNOSIS — M5431 Sciatica, right side: Secondary | ICD-10-CM | POA: Diagnosis not present

## 2023-10-20 DIAGNOSIS — Z7984 Long term (current) use of oral hypoglycemic drugs: Secondary | ICD-10-CM | POA: Diagnosis not present

## 2023-10-20 DIAGNOSIS — E1165 Type 2 diabetes mellitus with hyperglycemia: Secondary | ICD-10-CM | POA: Diagnosis not present

## 2023-10-20 DIAGNOSIS — M5432 Sciatica, left side: Secondary | ICD-10-CM | POA: Diagnosis not present

## 2023-10-29 ENCOUNTER — Other Ambulatory Visit: Admitting: Obstetrics & Gynecology

## 2023-12-16 ENCOUNTER — Other Ambulatory Visit: Admitting: Obstetrics & Gynecology

## 2023-12-17 ENCOUNTER — Ambulatory Visit: Admitting: Obstetrics & Gynecology

## 2023-12-17 ENCOUNTER — Encounter: Payer: Self-pay | Admitting: Obstetrics & Gynecology

## 2023-12-17 ENCOUNTER — Other Ambulatory Visit (HOSPITAL_COMMUNITY)
Admission: RE | Admit: 2023-12-17 | Discharge: 2023-12-17 | Disposition: A | Discharge status: Home / Self Care | Source: Ambulatory Visit | Attending: Obstetrics & Gynecology | Admitting: Obstetrics & Gynecology

## 2023-12-17 VITALS — BP 154/66 | Ht 61.0 in | Wt 208.0 lb

## 2023-12-17 DIAGNOSIS — Z1151 Encounter for screening for human papillomavirus (HPV): Secondary | ICD-10-CM | POA: Insufficient documentation

## 2023-12-17 DIAGNOSIS — Z01419 Encounter for gynecological examination (general) (routine) without abnormal findings: Secondary | ICD-10-CM

## 2023-12-17 DIAGNOSIS — Z124 Encounter for screening for malignant neoplasm of cervix: Secondary | ICD-10-CM | POA: Insufficient documentation

## 2023-12-17 NOTE — Progress Notes (Signed)
 Subjective:     Annette Washington is a 68 y.o. female here for a routine exam.  No LMP recorded. Patient is postmenopausal. [id number] Birth Control Method:  na Menstrual Calendar(currently): na  Current complaints: sciatica.   Current acute medical issues:  BP adjusting   Recent Gynecologic History No LMP recorded. Patient is postmenopausal. Last Pap: 2022,  normal Last mammogram: 02/12/2023,  normal  Past Medical History:  Diagnosis Date   Arthritis    Diabetes mellitus without complication (HCC)    Hypertension    Pneumonia    Sciatica     Past Surgical History:  Procedure Laterality Date   CARPAL TUNNEL RELEASE Right 06/18/2020   Procedure: CARPAL TUNNEL RELEASE;  Surgeon: Tonita Frater, MD;  Location: [facility];  Service: Orthopedics;  Laterality: Right;   CATARACT EXTRACTION W/PHACO Left 04/12/2018   Procedure: CATARACT EXTRACTION PHACO AND INTRAOCULAR LENS PLACEMENT (IOC);  Surgeon: Anner Kill, MD;  Location: [facility];  Service: Ophthalmology;  Laterality: Left;  CDE:  7.79   CATARACT EXTRACTION W/PHACO Right 04/26/2018   Procedure: CATARACT EXTRACTION PHACO AND INTRAOCULAR LENS PLACEMENT RIGHT EYE CDE=6.51;  Surgeon: Anner Kill, MD;  Location: [facility];  Service: Ophthalmology;  Laterality: Right;  right   COLONOSCOPY N/A 02/12/2017   Procedure: COLONOSCOPY;  Surgeon: Ruby Corporal, MD;  Location: [facility];  Service: Endoscopy;  Laterality: N/A;  730   COLONOSCOPY WITH PROPOFOL  N/A 04/18/2020   Procedure: COLONOSCOPY WITH PROPOFOL ;  Surgeon: Ruby Corporal, MD;  Location: [facility];  Service: Endoscopy;  Laterality: N/A;  830   TONSILLECTOMY      OB History     Gravida  3   Para  3   Term  3   Preterm      AB      Living  2      SAB      IAB      Ectopic      Multiple      Live Births  2           Social History   Socioeconomic History   Marital status: Married    Spouse name: Not on file   Number of children: Not on file    Years of education: Not on file   Highest education level: Not on file  Occupational History   Not on file  Tobacco Use   Smoking status: Former    Types: Cigarettes   Smokeless tobacco: Never  Vaping Use   Vaping status: Never Used  Substance and Sexual Activity   Alcohol use: No   Drug use: No   Sexual activity: Not Currently    Birth control/protection: Post-menopausal  Other Topics Concern   Not on file  Social History Narrative   Not on file   Social Drivers of Health   Financial Resource Strain: Low Risk  (10/16/2020)   Overall Financial Resource Strain (CARDIA)    Difficulty of Paying Living Expenses: Not hard at all  Food Insecurity: No Food Insecurity (10/16/2020)   Hunger Vital Sign    Worried About Running Out of Food in the Last Year: Never true    Ran Out of Food in the Last Year: Never true  Transportation Needs: No Transportation Needs (10/16/2020)   PRAPARE - Administrator, Civil Service (Medical): No    Lack of Transportation (Non-Medical): No  Physical Activity: Inactive (10/16/2020)   Exercise Vital Sign    Days of  Exercise per Week: 0 days    Minutes of Exercise per Session: 0 min  Stress: No Stress Concern Present (10/16/2020)   [employer] of Occupational Health - Occupational Stress Questionnaire    Feeling of Stress : Not at all  Social Connections: Moderately Integrated (10/16/2020)   Social Connection and Isolation Panel    Frequency of Communication with Friends and Family: More than three times a week    Frequency of Social Gatherings with Friends and Family: Once a week    Attends Religious Services: More than 4 times per year    Active Member of [employer] or Organizations: No    Attends Engineer, structural: Never    Marital Status: Married    Family History  Problem Relation Age of Onset   Kidney disease Mother    Breast cancer Maternal Grandmother      Current Outpatient Medications:    azelastine (ASTELIN) 0.1  % nasal spray, Place 1 spray into both nostrils daily as needed for allergies., Disp: , Rfl:    benzonatate  (TESSALON ) 100 MG capsule, Take 1 capsule by mouth every 8 (eight) hours for cough., Disp: 21 capsule, Rfl: 0   cetirizine (ZYRTEC) 10 MG tablet, Take 10 mg by mouth daily as needed for allergies., Disp: , Rfl:    cholecalciferol (VITAMIN D3) 25 MCG (1000 UNIT) tablet, Take 1,000 Units by mouth daily., Disp: , Rfl:    ELDERBERRY PO, Take 1 capsule by mouth daily., Disp: , Rfl:    fluticasone (FLONASE) 50 MCG/ACT nasal spray, Place 2 sprays into both nostrils daily as needed for allergies or rhinitis., Disp: , Rfl:    ibuprofen (ADVIL) 200 MG tablet, Take 400 mg by mouth every 6 (six) hours as needed for mild pain or headache., Disp: , Rfl:    losartan (COZAAR) 100 MG tablet, Take 100 mg by mouth daily., Disp: , Rfl:    metFORMIN (GLUCOPHAGE) 500 MG tablet, Take 500 mg by mouth at bedtime. , Disp: , Rfl:    pravastatin (PRAVACHOL) 10 MG tablet, Take 10 mg by mouth daily., Disp: , Rfl:    tizanidine  (ZANAFLEX ) 2 MG capsule, Take 1 capsule (2 mg total) by mouth 3 (three) times daily as needed for muscle spasms. Do not drink alcohol or drive while taking this medication.  May cause drowsiness., Disp: 15 capsule, Rfl: 0   amLODipine (NORVASC) 5 MG tablet, Take 5 mg by mouth daily. (Patient not taking: Reported on 12/17/2023), Disp: , Rfl:    losartan-hydrochlorothiazide (HYZAAR) 50-12.5 MG tablet, Take 1 tablet by mouth daily. (Patient not taking: Reported on 12/17/2023), Disp: , Rfl:    predniSONE  (DELTASONE ) 20 MG tablet, Take 2 tablets (40 mg total) by mouth daily. (Patient not taking: Reported on 12/17/2023), Disp: 10 tablet, Rfl: 0  Review of Systems  Review of Systems  Constitutional: Negative for fever, chills, weight loss, malaise/fatigue and diaphoresis.  HENT: Negative for hearing loss, ear pain, nosebleeds, congestion, sore throat, neck pain, tinnitus and ear discharge.   Eyes: Negative  for blurred vision, double vision, photophobia, pain, discharge and redness.  Respiratory: Negative for cough, hemoptysis, sputum production, shortness of breath, wheezing and stridor.   Cardiovascular: Negative for chest pain, palpitations, orthopnea, claudication, leg swelling and PND.  Gastrointestinal: negative for abdominal pain. Negative for heartburn, nausea, vomiting, diarrhea, constipation, blood in stool and melena.  Genitourinary: Negative for dysuria, urgency, frequency, hematuria and flank pain.  Musculoskeletal: Negative for myalgias, back pain, joint pain and falls.  Skin:  Negative for itching and rash.  Neurological: Negative for dizziness, tingling, tremors, sensory change, speech change, focal weakness, seizures, loss of consciousness, weakness and headaches.  Endo/Heme/Allergies: Negative for environmental allergies and polydipsia. Does not bruise/bleed easily.  Psychiatric/Behavioral: Negative for depression, suicidal ideas, hallucinations, memory loss and substance abuse. The patient is not nervous/anxious and does not have insomnia.        Objective:  Blood pressure (!) 154/66, height 5' 1 (1.549 m), weight 208 lb (94.3 kg).   Physical Exam  Vitals reviewed. Constitutional: She is oriented to person, place, and time. She appears well-developed and well-nourished.  HENT:  Head: Normocephalic and atraumatic.        Right Ear: External ear normal.  Left Ear: External ear normal.  Nose: Nose normal.  Mouth/Throat: Oropharynx is clear and moist.  Eyes: Conjunctivae and EOM are normal. Pupils are equal, round, and reactive to light. Right eye exhibits no discharge. Left eye exhibits no discharge. No scleral icterus.  Neck: Normal range of motion. Neck supple. No tracheal deviation present. No thyromegaly present.  Cardiovascular: Normal rate, regular rhythm, normal heart sounds and intact distal pulses.  Exam reveals no gallop and no friction rub.   No murmur  heard. Respiratory: Effort normal and breath sounds normal. No respiratory distress. She has no wheezes. She has no rales. She exhibits no tenderness.  GI: Soft. Bowel sounds are normal. She exhibits no distension and no mass. There is no tenderness. There is no rebound and no guarding.  Genitourinary:  Breasts no masses skin changes or nipple changes bilaterally      Vulva is normal without lesions Vagina is pink moist without discharge Cervix normal in appearance and pap is done Uterus is normal size shape and contour Adnexa is negative with normal sized ovaries   Musculoskeletal: Normal range of motion. She exhibits no edema and no tenderness.  Neurological: She is alert and oriented to person, place, and time. She has normal reflexes. She displays normal reflexes. No cranial nerve deficit. She exhibits normal muscle tone. Coordination normal.  Skin: Skin is warm and dry. No rash noted. No erythema. No pallor.  Psychiatric: She has a normal mood and affect. Her behavior is normal. Judgment and thought content normal.       Medications Ordered at today's visit: No orders of the defined types were placed in this encounter.   Other orders placed at today's visit: No orders of the defined types were placed in this encounter.    ASSESSMENT + PLAN:    ICD-10-CM   1. Well woman exam with routine gynecological exam  Z01.419     2. Routine Papanicolaou smear  Z12.4 Cytology - PAP          Return in about 3 years (around 12/17/2026), or if symptoms worsen or fail to improve, for Follow up, with Dr Randolm Butte.

## 2023-12-18 DIAGNOSIS — E1165 Type 2 diabetes mellitus with hyperglycemia: Secondary | ICD-10-CM | POA: Diagnosis not present

## 2023-12-18 DIAGNOSIS — I10 Essential (primary) hypertension: Secondary | ICD-10-CM | POA: Diagnosis not present

## 2023-12-21 LAB — CYTOLOGY - PAP
Adequacy: ABSENT
Comment: NEGATIVE
Diagnosis: NEGATIVE
High risk HPV: NEGATIVE

## 2023-12-24 ENCOUNTER — Other Ambulatory Visit (HOSPITAL_COMMUNITY): Payer: Self-pay | Admitting: Family Medicine

## 2023-12-24 DIAGNOSIS — M5441 Lumbago with sciatica, right side: Secondary | ICD-10-CM

## 2023-12-24 DIAGNOSIS — R600 Localized edema: Secondary | ICD-10-CM | POA: Diagnosis not present

## 2023-12-24 DIAGNOSIS — M5442 Lumbago with sciatica, left side: Secondary | ICD-10-CM | POA: Diagnosis not present

## 2023-12-24 DIAGNOSIS — G8929 Other chronic pain: Secondary | ICD-10-CM | POA: Diagnosis not present

## 2023-12-24 DIAGNOSIS — Z79899 Other long term (current) drug therapy: Secondary | ICD-10-CM | POA: Diagnosis not present

## 2023-12-24 DIAGNOSIS — E782 Mixed hyperlipidemia: Secondary | ICD-10-CM | POA: Diagnosis not present

## 2023-12-24 DIAGNOSIS — I10 Essential (primary) hypertension: Secondary | ICD-10-CM | POA: Diagnosis not present

## 2023-12-24 DIAGNOSIS — M5430 Sciatica, unspecified side: Secondary | ICD-10-CM | POA: Diagnosis not present

## 2023-12-24 DIAGNOSIS — E1165 Type 2 diabetes mellitus with hyperglycemia: Secondary | ICD-10-CM | POA: Diagnosis not present

## 2023-12-30 ENCOUNTER — Ambulatory Visit (HOSPITAL_COMMUNITY)
Admission: RE | Admit: 2023-12-30 | Discharge: 2023-12-30 | Disposition: A | Discharge status: Home / Self Care | Source: Ambulatory Visit | Attending: Family Medicine | Admitting: Family Medicine

## 2023-12-30 DIAGNOSIS — M5441 Lumbago with sciatica, right side: Secondary | ICD-10-CM | POA: Insufficient documentation

## 2023-12-30 DIAGNOSIS — M4726 Other spondylosis with radiculopathy, lumbar region: Secondary | ICD-10-CM | POA: Diagnosis not present

## 2023-12-30 DIAGNOSIS — M5442 Lumbago with sciatica, left side: Secondary | ICD-10-CM | POA: Diagnosis not present

## 2023-12-30 DIAGNOSIS — M5116 Intervertebral disc disorders with radiculopathy, lumbar region: Secondary | ICD-10-CM | POA: Diagnosis not present

## 2023-12-30 DIAGNOSIS — G8929 Other chronic pain: Secondary | ICD-10-CM | POA: Diagnosis not present

## 2023-12-30 DIAGNOSIS — M4316 Spondylolisthesis, lumbar region: Secondary | ICD-10-CM | POA: Diagnosis not present

## 2023-12-30 DIAGNOSIS — M48061 Spinal stenosis, lumbar region without neurogenic claudication: Secondary | ICD-10-CM | POA: Diagnosis not present

## 2024-02-09 DIAGNOSIS — M5441 Lumbago with sciatica, right side: Secondary | ICD-10-CM | POA: Diagnosis not present

## 2024-02-09 DIAGNOSIS — Z79891 Long term (current) use of opiate analgesic: Secondary | ICD-10-CM | POA: Diagnosis not present

## 2024-02-09 DIAGNOSIS — G894 Chronic pain syndrome: Secondary | ICD-10-CM | POA: Diagnosis not present

## 2024-02-09 DIAGNOSIS — E66813 Obesity, class 3: Secondary | ICD-10-CM | POA: Diagnosis not present

## 2024-03-25 ENCOUNTER — Other Ambulatory Visit: Payer: Self-pay

## 2024-03-25 DIAGNOSIS — M4726 Other spondylosis with radiculopathy, lumbar region: Secondary | ICD-10-CM

## 2024-03-25 DIAGNOSIS — M5441 Lumbago with sciatica, right side: Secondary | ICD-10-CM

## 2024-04-11 NOTE — Discharge Instructions (Signed)

## 2024-04-12 ENCOUNTER — Ambulatory Visit
Admission: RE | Admit: 2024-04-12 | Discharge: 2024-04-12 | Disposition: A | Discharge status: Home / Self Care | Source: Ambulatory Visit

## 2024-04-12 DIAGNOSIS — M5441 Lumbago with sciatica, right side: Secondary | ICD-10-CM

## 2024-04-12 DIAGNOSIS — M5126 Other intervertebral disc displacement, lumbar region: Secondary | ICD-10-CM | POA: Diagnosis not present

## 2024-04-12 DIAGNOSIS — M47816 Spondylosis without myelopathy or radiculopathy, lumbar region: Secondary | ICD-10-CM | POA: Diagnosis not present

## 2024-04-12 DIAGNOSIS — M4726 Other spondylosis with radiculopathy, lumbar region: Secondary | ICD-10-CM

## 2024-04-12 MED ORDER — IOPAMIDOL (ISOVUE-M 200) INJECTION 41%
1.0000 mL | Freq: Once | INTRAMUSCULAR | Status: AC
  Administered 2024-04-12: 1 mL via EPIDURAL

## 2024-04-12 MED ORDER — METHYLPREDNISOLONE ACETATE 40 MG/ML INJ SUSP (RADIOLOG
80.0000 mg | Freq: Once | INTRAMUSCULAR | Status: AC
  Administered 2024-04-12: 80 mg via EPIDURAL

## 2024-04-25 ENCOUNTER — Ambulatory Visit: Admitting: Pain Medicine

## 2024-04-26 DIAGNOSIS — G894 Chronic pain syndrome: Secondary | ICD-10-CM | POA: Diagnosis not present

## 2024-04-26 DIAGNOSIS — M5441 Lumbago with sciatica, right side: Secondary | ICD-10-CM | POA: Diagnosis not present

## 2024-04-26 DIAGNOSIS — E66813 Obesity, class 3: Secondary | ICD-10-CM | POA: Diagnosis not present

## 2024-05-11 DIAGNOSIS — M5441 Lumbago with sciatica, right side: Secondary | ICD-10-CM | POA: Diagnosis not present

## 2024-05-11 DIAGNOSIS — I10 Essential (primary) hypertension: Secondary | ICD-10-CM | POA: Diagnosis not present

## 2024-05-11 DIAGNOSIS — Z87891 Personal history of nicotine dependence: Secondary | ICD-10-CM | POA: Diagnosis not present

## 2024-05-11 DIAGNOSIS — G8929 Other chronic pain: Secondary | ICD-10-CM | POA: Diagnosis not present

## 2024-05-11 DIAGNOSIS — M5442 Lumbago with sciatica, left side: Secondary | ICD-10-CM | POA: Diagnosis not present
